# Patient Record
Sex: Male | Born: 1985 | Race: Black or African American | Hispanic: No | Marital: Married | Smoking: Former smoker
Health system: Southern US, Community
[De-identification: ages and names within clinical notes are randomized; demographics above are authoritative.]

## PROBLEM LIST (undated history)

## (undated) DIAGNOSIS — N62 Hypertrophy of breast: Secondary | ICD-10-CM

---

## 2014-09-14 ENCOUNTER — Encounter (HOSPITAL_COMMUNITY): Payer: Self-pay | Admitting: *Deleted

## 2014-09-14 ENCOUNTER — Emergency Department (HOSPITAL_COMMUNITY)
Admission: EM | Admit: 2014-09-14 | Discharge: 2014-09-14 | Disposition: A | Discharge status: Home / Self Care | Payer: Federal, State, Local not specified - PPO | Attending: Emergency Medicine | Admitting: Emergency Medicine

## 2014-09-14 DIAGNOSIS — Z79899 Other long term (current) drug therapy: Secondary | ICD-10-CM | POA: Diagnosis not present

## 2014-09-14 DIAGNOSIS — K1379 Other lesions of oral mucosa: Secondary | ICD-10-CM | POA: Insufficient documentation

## 2014-09-14 DIAGNOSIS — R509 Fever, unspecified: Secondary | ICD-10-CM | POA: Insufficient documentation

## 2014-09-14 DIAGNOSIS — K088 Other specified disorders of teeth and supporting structures: Secondary | ICD-10-CM | POA: Diagnosis present

## 2014-09-14 DIAGNOSIS — K1370 Unspecified lesions of oral mucosa: Secondary | ICD-10-CM

## 2014-09-14 MED ORDER — IBUPROFEN 600 MG PO TABS: 600.0000 mg | ORAL_TABLET | Freq: Four times a day (QID) | ORAL | Status: DC | PRN

## 2014-09-14 MED ORDER — MAGIC MOUTHWASH: 5.0000 mL | Freq: Four times a day (QID) | ORAL | Status: DC | PRN

## 2014-09-14 MED ORDER — IBUPROFEN 200 MG PO TABS: 600.0000 mg | ORAL_TABLET | Freq: Once | ORAL | Status: DC

## 2014-09-14 MED ORDER — MAGIC MOUTHWASH: 5.0000 mL | Freq: Once | ORAL | Status: DC

## 2014-09-14 NOTE — ED Provider Notes (Signed)
CSN: [id number]     Arrival date & time 09/14/14  2150 History  This chart was scribed for Earley Favor, NP, working with Arby Barrette, MD by Chestine Spore, ED Scribe. The patient was seen in room WTR6/WTR6 at 10:02 PM.     Chief Complaint  Patient presents with  . Dental Pain      The history is provided by the patient. No language interpreter was used.    HPI Comments: Samuel Byrd is a 29 y.o. male who presents to the Emergency Department complaining of dental pain onset 2 days. Pt had an abscess to his gum 1 week ago that he treated with orajel but was not seen for. Pt woke up with the pain to the roof of his mouth and he didn't do anything to make the pain start. He notes that the pain is to the roof of his mouth that is worsened with drinking hot or cold fluids. He reports that there is no pain when he is not touching the area. He states that he is having associated symptoms of fever without a documented temperature. He denies tooth pain, rhinorrhea, fever, nasal pain, and any other symptoms.   History reviewed. No pertinent past medical history. History reviewed. No pertinent past surgical history. No family history on file. History  Substance Use Topics  . Smoking status: Never Smoker   . Smokeless tobacco: Not on file  . Alcohol Use: Yes    Review of Systems  Constitutional: Positive for fever (undocumented).  HENT: Negative for dental problem and rhinorrhea.        Pain to the roof of the mouth      Allergies  Review of patient's allergies indicates not on file.  Home Medications   Prior to Admission medications   Medication Sig Start Date End Date Taking? Authorizing Provider  Alum & Mag Hydroxide-Simeth (MAGIC MOUTHWASH) SOLN Take 5 mLs by mouth 4 (four) times daily as needed for mouth pain. 09/14/14   Earley Favor, NP  ibuprofen (ADVIL,MOTRIN) 600 MG tablet Take 1 tablet (600 mg total) by mouth every 6 (six) hours as needed for moderate pain. 09/14/14   Earley Favor,  NP   BP 106/72 mmHg  Pulse 73  Temp(Src) 98.5 F (36.9 C) (Oral)  Resp 13  SpO2 100% Physical Exam  Constitutional: He is oriented to person, place, and time. He appears well-developed and well-nourished. No distress.  HENT:  Head: Normocephalic and atraumatic.  Mouth/Throat: No dental abscesses.  No pain in teeth. Small area that is slightly raised and tender to touch.   Eyes: EOM are normal.  Neck: Neck supple. No tracheal deviation present.  Cardiovascular: Normal rate.   Pulmonary/Chest: Effort normal. No respiratory distress.  Musculoskeletal: Normal range of motion.  Lymphadenopathy:  No LAD  Neurological: He is alert and oriented to person, place, and time.  Skin: Skin is warm and dry.  Psychiatric: He has a normal mood and affect. His behavior is normal.  Nursing note and vitals reviewed.   ED Course  Procedures (including critical care time) DIAGNOSTIC STUDIES: Oxygen Saturation is 100% on RA, nl by my interpretation.    COORDINATION OF CARE: 10:07 PM-Discussed treatment plan with pt at bedside and pt agreed to plan.   Labs Review Labs Reviewed - No data to display  Imaging Review No results found.   EKG Interpretation None     Dr. Clarice Pole examined the patient as well.  There is no sign of illness.  At this  point, it may be viral in nature or traumatic due to a burn or trauma from food.  He's been given Magic mouthwash and ibuprofen and return precautions as well as referral to ENT if needed MDM   Final diagnoses:  Mouth lesion   I personally performed the services described in this documentation, which was scribed in my presence. The recorded information has been reviewed and is accurate.   Earley FavorGail Brynnlee Cumpian, NP 09/14/14 [phone]  Arby BarretteMarcy Pfeiffer, MD 09/18/14 [phone]

## 2014-09-14 NOTE — ED Notes (Signed)
Pt complains of pain in the roof of his mouth for the past 2 days. Pt states the pain is worse when drinking hot/cold drinks.

## 2014-09-14 NOTE — Discharge Instructions (Signed)
You have very tiny skin erosions on the palate of the mouth which is consistent with trauma.  This can be from foods that you've eaten or fluids that are too hot.  He been given prescription for ibuprofen to take for comfort as well as Magic mouthwash that he continues to soothe the area.  If you develop worsening ulcers new symptoms, fever, swollen glands return for further evaluation

## 2015-07-22 ENCOUNTER — Emergency Department (HOSPITAL_COMMUNITY): Payer: Federal, State, Local not specified - PPO

## 2015-07-22 ENCOUNTER — Encounter (HOSPITAL_COMMUNITY): Payer: Self-pay | Admitting: Emergency Medicine

## 2015-07-22 ENCOUNTER — Observation Stay (HOSPITAL_COMMUNITY)
Admission: EM | Admit: 2015-07-22 | Discharge: 2015-07-24 | Disposition: A | Discharge status: Home / Self Care | Payer: Federal, State, Local not specified - PPO | Attending: Internal Medicine | Admitting: Internal Medicine

## 2015-07-22 DIAGNOSIS — R55 Syncope and collapse: Secondary | ICD-10-CM | POA: Insufficient documentation

## 2015-07-22 DIAGNOSIS — Z8711 Personal history of peptic ulcer disease: Secondary | ICD-10-CM | POA: Insufficient documentation

## 2015-07-22 DIAGNOSIS — Y92002 Bathroom of unspecified non-institutional (private) residence single-family (private) house as the place of occurrence of the external cause: Secondary | ICD-10-CM | POA: Insufficient documentation

## 2015-07-22 DIAGNOSIS — W228XXA Striking against or struck by other objects, initial encounter: Secondary | ICD-10-CM | POA: Diagnosis not present

## 2015-07-22 DIAGNOSIS — K921 Melena: Secondary | ICD-10-CM | POA: Diagnosis not present

## 2015-07-22 DIAGNOSIS — D62 Acute posthemorrhagic anemia: Secondary | ICD-10-CM | POA: Diagnosis not present

## 2015-07-22 DIAGNOSIS — R9431 Abnormal electrocardiogram [ECG] [EKG]: Secondary | ICD-10-CM | POA: Insufficient documentation

## 2015-07-22 DIAGNOSIS — K922 Gastrointestinal hemorrhage, unspecified: Secondary | ICD-10-CM | POA: Diagnosis not present

## 2015-07-22 DIAGNOSIS — R42 Dizziness and giddiness: Secondary | ICD-10-CM | POA: Insufficient documentation

## 2015-07-22 DIAGNOSIS — K259 Gastric ulcer, unspecified as acute or chronic, without hemorrhage or perforation: Secondary | ICD-10-CM | POA: Insufficient documentation

## 2015-07-22 DIAGNOSIS — R17 Unspecified jaundice: Secondary | ICD-10-CM | POA: Insufficient documentation

## 2015-07-22 DIAGNOSIS — K269 Duodenal ulcer, unspecified as acute or chronic, without hemorrhage or perforation: Secondary | ICD-10-CM | POA: Diagnosis not present

## 2015-07-22 DIAGNOSIS — R110 Nausea: Secondary | ICD-10-CM | POA: Insufficient documentation

## 2015-07-22 DIAGNOSIS — K2950 Unspecified chronic gastritis without bleeding: Secondary | ICD-10-CM | POA: Insufficient documentation

## 2015-07-22 DIAGNOSIS — K254 Chronic or unspecified gastric ulcer with hemorrhage: Secondary | ICD-10-CM | POA: Diagnosis not present

## 2015-07-22 LAB — BILIRUBIN, FRACTIONATED(TOT/DIR/INDIR)
BILIRUBIN DIRECT: 0.2 mg/dL (ref 0.1–0.5)
Indirect Bilirubin: 1.3 mg/dL — ABNORMAL HIGH (ref 0.3–0.9)
Total Bilirubin: 1.5 mg/dL — ABNORMAL HIGH (ref 0.3–1.2)

## 2015-07-22 LAB — COMPREHENSIVE METABOLIC PANEL
ALK PHOS: 35 U/L — AB (ref 38–126)
ALT: 15 U/L — ABNORMAL LOW (ref 17–63)
ANION GAP: 4 — AB (ref 5–15)
AST: 19 U/L (ref 15–41)
Albumin: 3.7 g/dL (ref 3.5–5.0)
BILIRUBIN TOTAL: 1.7 mg/dL — AB (ref 0.3–1.2)
BUN: 39 mg/dL — AB (ref 6–20)
CALCIUM: 8.7 mg/dL — AB (ref 8.9–10.3)
CO2: 25 mmol/L (ref 22–32)
CREATININE: 0.88 mg/dL (ref 0.61–1.24)
Chloride: 105 mmol/L (ref 101–111)
GFR calc non Af Amer: >60 mL/min (ref >60)
Glucose, Bld: 143 mg/dL — ABNORMAL HIGH (ref 65–99)
POTASSIUM: 4.1 mmol/L (ref 3.5–5.1)
Sodium: 134 mmol/L — ABNORMAL LOW (ref 135–145)
Total Protein: 6.9 g/dL (ref 6.5–8.1)

## 2015-07-22 LAB — PROTIME-INR
INR: 1.13 (ref 0.00–1.49)
PROTHROMBIN TIME: 14.7 s (ref 11.6–15.2)

## 2015-07-22 LAB — URINALYSIS, ROUTINE W REFLEX MICROSCOPIC
Bilirubin Urine: NEGATIVE
Glucose, UA: NEGATIVE mg/dL
Hgb urine dipstick: NEGATIVE
KETONES UR: NEGATIVE mg/dL
LEUKOCYTES UA: NEGATIVE
Nitrite: NEGATIVE
PH: 6 (ref 5.0–8.0)
PROTEIN: NEGATIVE mg/dL
Specific Gravity, Urine: 1.028 (ref 1.005–1.030)

## 2015-07-22 LAB — ABO/RH: ABO/RH(D): O POS

## 2015-07-22 LAB — CBC
HCT: 27.1 % — ABNORMAL LOW (ref 39.0–52.0)
Hemoglobin: 9.3 g/dL — ABNORMAL LOW (ref 13.0–17.0)
MCH: 26.3 pg (ref 26.0–34.0)
MCHC: 34.3 g/dL (ref 30.0–36.0)
MCV: 76.6 fL — AB (ref 78.0–100.0)
PLATELETS: 178 10*3/uL (ref 150–400)
RBC: 3.54 MIL/uL — ABNORMAL LOW (ref 4.22–5.81)
RDW: 13.1 % (ref 11.5–15.5)
WBC: 4.8 10*3/uL (ref 4.0–10.5)

## 2015-07-22 LAB — HEMOGLOBIN AND HEMATOCRIT, BLOOD
HCT: 26 % — ABNORMAL LOW (ref 39.0–52.0)
Hemoglobin: 9 g/dL — ABNORMAL LOW (ref 13.0–17.0)

## 2015-07-22 LAB — PREPARE RBC (CROSSMATCH)

## 2015-07-22 LAB — CBG MONITORING, ED: Glucose-Capillary: 111 mg/dL — ABNORMAL HIGH (ref 65–99)

## 2015-07-22 LAB — RETICULOCYTES
RBC.: 3.41 MIL/uL — AB (ref 4.22–5.81)
RETIC CT PCT: 1.5 % (ref 0.4–3.1)
Retic Count, Absolute: 51.2 10*3/uL (ref 19.0–186.0)

## 2015-07-22 LAB — TROPONIN I

## 2015-07-22 LAB — I-STAT CG4 LACTIC ACID, ED: LACTIC ACID, VENOUS: 0.8 mmol/L (ref 0.5–2.0)

## 2015-07-22 MED ORDER — SODIUM CHLORIDE 0.9 % IV SOLN
INTRAVENOUS | Status: DC
  Administered 2015-07-22 – 2015-07-23 (×2): via INTRAVENOUS

## 2015-07-22 MED ORDER — SODIUM CHLORIDE 0.9 % IV SOLN
80.0000 mg | Freq: Once | INTRAVENOUS | Status: AC
  Administered 2015-07-22: 80 mg via INTRAVENOUS
  Filled 2015-07-22: qty 80

## 2015-07-22 MED ORDER — TRAMADOL HCL 50 MG PO TABS
50.0000 mg | ORAL_TABLET | Freq: Three times a day (TID) | ORAL | Status: DC | PRN
  Administered 2015-07-22: 50 mg via ORAL
  Filled 2015-07-22: qty 1

## 2015-07-22 MED ORDER — SODIUM CHLORIDE 0.9% FLUSH
3.0000 mL | Freq: Two times a day (BID) | INTRAVENOUS | Status: DC
  Administered 2015-07-23: 3 mL via INTRAVENOUS

## 2015-07-22 MED ORDER — SODIUM CHLORIDE 0.9 % IV SOLN
8.0000 mg/h | INTRAVENOUS | Status: DC
  Administered 2015-07-23: 8 mg/h via INTRAVENOUS
  Filled 2015-07-22 (×5): qty 80

## 2015-07-22 MED ORDER — SODIUM CHLORIDE 0.9 % IV SOLN
INTRAVENOUS | Status: DC
  Administered 2015-07-23: 08:00:00 via INTRAVENOUS

## 2015-07-22 MED ORDER — ONDANSETRON HCL 4 MG PO TABS: 4.0000 mg | ORAL_TABLET | Freq: Four times a day (QID) | ORAL | Status: DC | PRN

## 2015-07-22 MED ORDER — SODIUM CHLORIDE 0.9 % IV SOLN
Freq: Once | INTRAVENOUS | Status: AC
  Administered 2015-07-22: 21:00:00 via INTRAVENOUS

## 2015-07-22 MED ORDER — ACETAMINOPHEN 325 MG PO TABS: 650.0000 mg | ORAL_TABLET | Freq: Four times a day (QID) | ORAL | Status: DC | PRN

## 2015-07-22 MED ORDER — ACETAMINOPHEN 650 MG RE SUPP: 650.0000 mg | Freq: Four times a day (QID) | RECTAL | Status: DC | PRN

## 2015-07-22 MED ORDER — ONDANSETRON HCL 4 MG/2ML IJ SOLN: 4.0000 mg | Freq: Four times a day (QID) | INTRAMUSCULAR | Status: DC | PRN

## 2015-07-22 MED ORDER — PANTOPRAZOLE SODIUM 40 MG IV SOLR: 40.0000 mg | Freq: Two times a day (BID) | INTRAVENOUS | Status: DC

## 2015-07-22 NOTE — ED Notes (Signed)
Hospitalist at bedside 

## 2015-07-22 NOTE — ED Provider Notes (Signed)
CSN: [id number]     Arrival date & time 07/22/15  1515 History   First MD Initiated Contact with Patient 07/22/15 1558     Chief Complaint  Patient presents with  . Nausea  . Dizziness     Patient is a 30 y.o. male presenting with dizziness. The history is provided by the patient and a parent. No language interpreter was used.  Dizziness  Khaliel Morey is a 30 y.o. male who presents to the Emergency Department complaining of dizziness.  He was in his routine state of health until this morning when he began to feel dizzy/lightheaded.  Sxs are worse with standing.  He fell this morning in the bathroom and hit the back of his head, no LOC.  Since he hit his head he has a light headache and nausea as well as persistent dizziness.  He has a hx/o ulcer and takes pepcid daily, no additional meds. He occasional drinks alcohol.  No prior similar sxs. No associated chest pain, SOB, fever, cough, abdominal pain, vomiting, diarrhea, constipation, hematochezia, melena, leg swelling.  Sxs are severe, constant, worsening.    Past Medical History  Diagnosis Date  . Ulcer, stomach peptic    History reviewed. No pertinent past surgical history. No family history on file. Social History  Substance Use Topics  . Smoking status: Never Smoker   . Smokeless tobacco: None  . Alcohol Use: Yes    Review of Systems  Neurological: Positive for dizziness.  All other systems reviewed and are negative.     Allergies  Review of patient's allergies indicates no known allergies.  Home Medications   Prior to Admission medications   Medication Sig Start Date End Date Taking? Authorizing Provider  famotidine (PEPCID AC) 10 MG chewable tablet Chew 10-20 mg by mouth daily as needed for heartburn.   Yes Historical Provider, MD   BP 97/62 mmHg  Pulse 66  Temp(Src) 98.4 F (36.9 C) (Oral)  Resp 24  Ht 5\' 7"  (1.702 m)  Wt 130 lb (58.968 kg)  BMI 20.36 kg/m2  SpO2 100% Physical Exam  Constitutional: He is  oriented to person, place, and time. He appears well-developed and well-nourished. He appears distressed.  HENT:  Head: Normocephalic and atraumatic.  Cardiovascular: Normal rate and regular rhythm.   No murmur heard. Pulmonary/Chest: Effort normal and breath sounds normal. No respiratory distress.  Abdominal: Soft. There is no tenderness. There is no rebound and no guarding.  Genitourinary:  Melanotic stool, heme positive  Musculoskeletal: He exhibits no edema or tenderness.  Neurological: He is alert and oriented to person, place, and time.  Generalized weakness.  Skin: Skin is warm and dry.  Psychiatric: He has a normal mood and affect. His behavior is normal.  Nursing note and vitals reviewed.   ED Course  Procedures (including critical care time) Labs Review Labs Reviewed  CBC - Abnormal; Notable for the following:    RBC 3.54 (*)    Hemoglobin 9.3 (*)    HCT 27.1 (*)    MCV 76.6 (*)    All other components within normal limits  COMPREHENSIVE METABOLIC PANEL - Abnormal; Notable for the following:    Sodium 134 (*)    Glucose, Bld 143 (*)    BUN 39 (*)    Calcium 8.7 (*)    ALT 15 (*)    Alkaline Phosphatase 35 (*)    Total Bilirubin 1.7 (*)    Anion gap 4 (*)    All other components within  normal limits  VITAMIN B12 - Abnormal; Notable for the following:    Vitamin B-12 1019 (*)    All other components within normal limits  IRON AND TIBC - Abnormal; Notable for the following:    Iron 205 (*)    Saturation Ratios 61 (*)    All other components within normal limits  RETICULOCYTES - Abnormal; Notable for the following:    RBC. 3.41 (*)    All other components within normal limits  BILIRUBIN, FRACTIONATED(TOT/DIR/INDIR) - Abnormal; Notable for the following:    Total Bilirubin 1.5 (*)    Indirect Bilirubin 1.3 (*)    All other components within normal limits  HEMOGLOBIN AND HEMATOCRIT, BLOOD - Abnormal; Notable for the following:    Hemoglobin 9.0 (*)    HCT 26.0  (*)    All other components within normal limits  CBG MONITORING, ED - Abnormal; Notable for the following:    Glucose-Capillary 111 (*)    All other components within normal limits  URINALYSIS, ROUTINE W REFLEX MICROSCOPIC (NOT AT [facility])  FOLATE  FERRITIN  PROTIME-INR  TROPONIN I  HIV ANTIBODY (ROUTINE TESTING)  COMPREHENSIVE METABOLIC PANEL  CBC  HEMOGLOBIN AND HEMATOCRIT, BLOOD  HEMOGLOBIN AND HEMATOCRIT, BLOOD  I-STAT CG4 LACTIC ACID, ED  TYPE AND SCREEN  PREPARE RBC (CROSSMATCH)  ABO/RH    Imaging Review Ct Head Wo Contrast  07/22/2015  CLINICAL DATA:  Dizziness, headache EXAM: CT HEAD WITHOUT CONTRAST TECHNIQUE: Contiguous axial images were obtained from the base of the skull through the vertex without intravenous contrast. COMPARISON:  None. FINDINGS: There is no evidence of mass effect, midline shift or extra-axial fluid collections. There is no evidence of a space-occupying lesion or intracranial hemorrhage. There is no evidence of a cortical-based area of acute infarction. The ventricles and sulci are appropriate for the patient's age. The basal cisterns are patent. Visualized portions of the orbits are unremarkable. The visualized portions of the paranasal sinuses and mastoid air cells are unremarkable. The osseous structures are unremarkable. IMPRESSION: Normal CT of the brain without intravenous contrast. Electronically Signed   By: Elige Ko   On: 07/22/2015 16:28   Dg Chest Port 1 View  07/22/2015  CLINICAL DATA:  Headache today; no chest complaints today; non smoker EXAM: PORTABLE CHEST 1 VIEW COMPARISON:  None. FINDINGS: The heart size and mediastinal contours are within normal limits. Both lungs are clear. The visualized skeletal structures are unremarkable. IMPRESSION: No active disease. Electronically Signed   By: Norva Pavlov M.D.   On: 07/22/2015 16:15   I have personally reviewed and evaluated these images and lab results as part of my medical  decision-making.   EKG Interpretation   Date/Time:  Sunday Jul 22 2015 15:41:42 EDT Ventricular Rate:  106 PR Interval:  115 QRS Duration: 85 QT Interval:  325 QTC Calculation: 431 R Axis:   75 Text Interpretation:  Sinus tachycardia Consider right atrial enlargement  ST elevation suggests acute pericarditis Baseline wander in lead(s) V2 V3  Confirmed by Lincoln Brigham [phone]) on 07/22/2015 4:37:18 PM      MDM   Final diagnoses:  Acute upper GI bleed   Patient here for evaluation of dizziness, head injury earlier today. CBC demonstrates anemia with hemoglobin of 9.3. He has melanotic stool on rectal exam. Discussed the patient findings of GI bleed and need for admission for further treatment. His blood pressures have stabilized the emergency department, will not transfuse at this time. Plan to admit for further treatment. He  was given Protonix for presumed peptic ulcer bleed. Hospitalist consultated for admission.  D/w Dr. Elnoria HowardHung with [facility], will seen in consult.  Recommends the patient be kept NPO.     Tilden FossaElizabeth Elexius Minar, MD 07/23/15 [phone]

## 2015-07-22 NOTE — Consult Note (Signed)
Consult Note for [facility]  Reason for Consult: Anemia, heme positive stool, and syncope Referring Physician: [facility]  [place] HPI: This is a 30 year old male with a PMH of peptic ulcer who is admitted to the hospital after a syncopal episode.  He reports feeling unwell this morning and he passed out for approximately 5 minutes.  Afterwards he continued to feel dizzy and lightheaded.  These symptoms worsened with standing.  The patient reports having melena that started this morning.  In the past he was clinically diagnosed with PUD when he was living in [place] and Pepcid was prescribed, but no EGD was performed and he did not have melena.  Over the intervening two years he reports intermittent abdominal pain and nausea, however, these symptoms markedly worsened.  He does use ibuprofen occasionally and there is no history of ETOH.  In the ER his HGB was noted to be in the 9 range, but there were no baseline values for comparison.  A CT scan of his head was negative for any trauma.  The patient was noted to be hypotensive initially, but he responded well to IV hydration.    Past Medical History  Diagnosis Date  . Ulcer, stomach peptic     History reviewed. No pertinent past surgical history.  No family history on file.  Social History:  reports that he has never smoked. He does not have any smokeless tobacco history on file. He reports that he drinks alcohol. His drug history is not on file.  Allergies: No Known Allergies  Medications:  Scheduled: . sodium chloride   Intravenous Once  . [START ON 07/26/2015] pantoprazole (PROTONIX) IV  40 mg Intravenous Q12H  . sodium chloride flush  3 mL Intravenous Q12H   Continuous: . sodium chloride 100 mL/hr at 07/22/15 1845  . pantoprozole (PROTONIX) infusion      Results for orders placed or performed during the hospital encounter of 07/22/15 (from the past 24 hour(s))  CBG monitoring, ED     Status: Abnormal   Collection Time:  07/22/15  4:08 PM  Result Value Ref Range   Glucose-Capillary 111 (H) 65 - 99 mg/dL  CBC     Status: Abnormal   Collection Time: 07/22/15  4:29 PM  Result Value Ref Range   WBC 4.8 4.0 - 10.5 K/uL   RBC 3.54 (L) 4.22 - 5.81 MIL/uL   Hemoglobin 9.3 (L) 13.0 - 17.0 g/dL   HCT 16.1 (L) 09.6 - 04.5 %   MCV 76.6 (L) 78.0 - 100.0 fL   MCH 26.3 26.0 - 34.0 pg   MCHC 34.3 30.0 - 36.0 g/dL   RDW 40.9 81.1 - 91.4 %   Platelets 178 150 - 400 K/uL  Comprehensive metabolic panel     Status: Abnormal   Collection Time: 07/22/15  4:29 PM  Result Value Ref Range   Sodium 134 (L) 135 - 145 mmol/L   Potassium 4.1 3.5 - 5.1 mmol/L   Chloride 105 101 - 111 mmol/L   CO2 25 22 - 32 mmol/L   Glucose, Bld 143 (H) 65 - 99 mg/dL   BUN 39 (H) 6 - 20 mg/dL   Creatinine, Ser 7.82 0.61 - 1.24 mg/dL   Calcium 8.7 (L) 8.9 - 10.3 mg/dL   Total Protein 6.9 6.5 - 8.1 g/dL   Albumin 3.7 3.5 - 5.0 g/dL   AST 19 15 - 41 U/L   ALT 15 (L) 17 - 63 U/L   Alkaline Phosphatase  35 (L) 38 - 126 U/L   Total Bilirubin 1.7 (H) 0.3 - 1.2 mg/dL   GFR calc non Af Amer >60 >60 mL/min   GFR calc Af Amer >60 >60 mL/min   Anion gap 4 (L) 5 - 15  I-Stat CG4 Lactic Acid, ED     Status: None   Collection Time: 07/22/15  4:34 PM  Result Value Ref Range   Lactic Acid, Venous 0.80 0.5 - 2.0 mmol/L  Urinalysis, Routine w reflex microscopic     Status: None   Collection Time: 07/22/15  5:57 PM  Result Value Ref Range   Color, Urine YELLOW YELLOW   APPearance CLEAR CLEAR   Specific Gravity, Urine 1.028 1.005 - 1.030   pH 6.0 5.0 - 8.0   Glucose, UA NEGATIVE NEGATIVE mg/dL   Hgb urine dipstick NEGATIVE NEGATIVE   Bilirubin Urine NEGATIVE NEGATIVE   Ketones, ur NEGATIVE NEGATIVE mg/dL   Protein, ur NEGATIVE NEGATIVE mg/dL   Nitrite NEGATIVE NEGATIVE   Leukocytes, UA NEGATIVE NEGATIVE  Type and screen [facility]     Status: None (Preliminary result)   Collection Time: 07/22/15  6:03 PM  Result Value Ref Range    ABO/RH(D) O POS    Antibody Screen NEG    Sample Expiration 07/25/2015    Unit Number [id number]    Blood Component Type RED CELLS,LR    Unit division 00    Status of Unit ALLOCATED    Transfusion Status OK TO TRANSFUSE    Crossmatch Result Compatible   ABO/Rh     Status: None   Collection Time: 07/22/15  6:03 PM  Result Value Ref Range   ABO/RH(D) O POS   Reticulocytes     Status: Abnormal   Collection Time: 07/22/15  6:35 PM  Result Value Ref Range   Retic Ct Pct 1.5 0.4 - 3.1 %   RBC. 3.41 (L) 4.22 - 5.81 MIL/uL   Retic Count, Manual 51.2 19.0 - 186.0 K/uL  Bilirubin, fractionated(tot/dir/indir)     Status: Abnormal   Collection Time: 07/22/15  6:35 PM  Result Value Ref Range   Total Bilirubin 1.5 (H) 0.3 - 1.2 mg/dL   Bilirubin, Direct 0.2 0.1 - 0.5 mg/dL   Indirect Bilirubin 1.3 (H) 0.3 - 0.9 mg/dL  Prepare RBC     Status: None   Collection Time: 07/22/15  6:35 PM  Result Value Ref Range   Order Confirmation ORDER PROCESSED BY BLOOD BANK   Protime-INR     Status: None   Collection Time: 07/22/15  6:35 PM  Result Value Ref Range   Prothrombin Time 14.7 11.6 - 15.2 seconds   INR 1.13 0.00 - 1.49  Troponin I     Status: None   Collection Time: 07/22/15  6:35 PM  Result Value Ref Range   Troponin I <0.03 <0.031 ng/mL  Hemoglobin and hematocrit, blood     Status: Abnormal   Collection Time: 07/22/15  6:49 PM  Result Value Ref Range   Hemoglobin 9.0 (L) 13.0 - 17.0 g/dL   HCT 11.9 (L) 14.7 - 82.9 %     Ct Head Wo Contrast  07/22/2015  CLINICAL DATA:  Dizziness, headache EXAM: CT HEAD WITHOUT CONTRAST TECHNIQUE: Contiguous axial images were obtained from the base of the skull through the vertex without intravenous contrast. COMPARISON:  None. FINDINGS: There is no evidence of mass effect, midline shift or extra-axial fluid collections. There is no evidence of a space-occupying lesion or intracranial hemorrhage.  There is no evidence of a cortical-based area of acute  infarction. The ventricles and sulci are appropriate for the patient's age. The basal cisterns are patent. Visualized portions of the orbits are unremarkable. The visualized portions of the paranasal sinuses and mastoid air cells are unremarkable. The osseous structures are unremarkable. IMPRESSION: Normal CT of the brain without intravenous contrast. Electronically Signed   By: Elige KoHetal  Patel   On: 07/22/2015 16:28   Dg Chest Port 1 View  07/22/2015  CLINICAL DATA:  Headache today; no chest complaints today; non smoker EXAM: PORTABLE CHEST 1 VIEW COMPARISON:  None. FINDINGS: The heart size and mediastinal contours are within normal limits. Both lungs are clear. The visualized skeletal structures are unremarkable. IMPRESSION: No active disease. Electronically Signed   By: Norva PavlovElizabeth  Brown M.D.   On: 07/22/2015 16:15    ROS:  As stated above in the HPI otherwise negative.  Blood pressure 108/71, pulse 83, temperature 98.2 F (36.8 C), temperature source Oral, resp. rate 18, height 5\' 7"  (1.702 m), weight 58.968 kg (130 lb), SpO2 100 %.    PE: Gen: NAD, Alert and Oriented HEENT:  [place]/AT, EOMI Neck: Supple, no LAD Lungs: CTA Bilaterally CV: RRR without M/G/R ABM: Soft, minimal periumbilical pain, +BS Ext: No C/C/E  Assessment/Plan: 1) Probable upper GI bleed. 2) Anemia. 3) Melena/Heme positive stool.   With his presentation and EGD will be pursued.  He his hemodynamically stable at this time.  From [place] H[place] Pyl[place] needs to be considered as a precipitating source of PUD, or at least a contributing factor.  Plan: 1) EGD tomorrow. 2) Agree with PPI. 3) Follow HGB and transfuse as necessary.  Rumaysa Sabatino D 07/22/2015, 7:52 PM

## 2015-07-22 NOTE — H&P (Signed)
History and Physical    Niv Darley ZOX:[id number] DOB: 1986-03-08 DOA: 07/22/2015  PCP: No primary care provider on file.  Patient coming from: Home  Chief Complaint: Dizziness, nausea.   HPI: Samuel Byrd is a 30 y.o. male with no medical history who presents after a syncope episode. Patient was using the bathroom when he stand up , he felt dizzy and pass out. He hit his head with the floor. He think he was out for 5 minutes. Denies tongue bite , urinary incontinence. He is feeling lightheaded and dizziness, specially when he stands up. He report poor oral intake since yesterday and nausea. He has had abdominal pain on and off for 2 years. He was prescribe Pepcid to treat presume ulcer. His abdominal pain gets worse with spicy, fatty food. Patient was notice to have melena and occult blood positive on rectal exam perform by ED physician. Patient report black stool started this am. He report abdominal pain, nausea. No diarrhea. Maybe some weight loss per wife. Patient complaining of headaches. He denies chest pain or dyspnea.  He takes ibuprofen occasionally.   ED Course: Rectal exam; melena, occult blood positive, Hb at 9, BUN 39. CT head negative, chest x ray negative.   Review of Systems: negative, except as per HPI   Past Medical History  Diagnosis Date  . Ulcer, stomach peptic     PSH; none  reports that he has never smoked. He does not have any smokeless tobacco history on file. He reports that he drinks alcohol. His drug history is not on file. drinks alcohol occassionally   No Known Allergies  Family History; Mother deceased, cardiac arrest unknown cause. Father history of hemorrhoids.   Prior to Admission medications   Medication Sig Start Date End Date Taking? Authorizing Provider  famotidine (PEPCID AC) 10 MG chewable tablet Chew 10-20 mg by mouth daily as needed for heartburn.   Yes Historical Provider, MD    Physical Exam: Filed Vitals:   07/22/15 1730 07/22/15 1745  07/22/15 1800 07/22/15 1809  BP: 107/74 107/67 108/71 108/71  Pulse: 81 87 83 83  Temp:      TempSrc:      Resp: Height:      Weight:      SpO2: 100% 100% 100% 100%      Constitutional: NAD, calm, comfortable Filed Vitals:   07/22/15 1730 07/22/15 1745 07/22/15 1800 07/22/15 1809  BP: 107/74 107/67 108/71 108/71  Pulse: 81 87 83 83  Temp:      TempSrc:      Resp: Height:      Weight:      SpO2: 100% 100% 100% 100%   Eyes: PERRL, lids and conjunctivae normal, pale ENMT: Mucous membranes are moist. Posterior pharynx clear of any exudate or lesions.Normal dentition.  Neck: normal, supple, no masses, no thyromegaly Respiratory: clear to auscultation bilaterally, no wheezing, no crackles. Normal respiratory effort. No accessory muscle use.  Cardiovascular: Regular rate and rhythm, no murmurs / rubs / gallops. No extremity edema. 2+ pedal pulses. No carotid bruits.  Abdomen: no tenderness, no masses palpated. No hepatosplenomegaly. Bowel sounds positive.  Musculoskeletal: no clubbing / cyanosis. No joint deformity upper and lower extremities. Good ROM, no contractures. Normal muscle tone.  Skin: no rashes, lesions, ulcers. No induration Neurologic: CN 2-12 grossly intact. Sensation intact, DTR normal. Strength 5/5 in all 4.  Psychiatric: Normal judgment and insight. Alert and oriented x 3. Normal  mood.   Labs on Admission: I have personally reviewed following labs and imaging studies  CBC:  Recent Labs Lab 07/22/15 1629  WBC 4.8  HGB 9.3*  HCT 27.1*  MCV 76.6*  PLT 178   Basic Metabolic Panel:  Recent Labs Lab 07/22/15 1629  NA 134*  K 4.1  CL 105  CO2 25  GLUCOSE 143*  BUN 39*  CREATININE 0.88  CALCIUM 8.7*   GFR: Estimated Creatinine Clearance: 100.6 mL/min (by C-G formula based on Cr of 0.88). Liver Function Tests:  Recent Labs Lab 07/22/15 1629  AST 19  ALT 15*  ALKPHOS 35*  BILITOT 1.7*  PROT 6.9  ALBUMIN 3.7   No  results for input(s): LIPASE, AMYLASE in the last 168 hours. No results for input(s): AMMONIA in the last 168 hours. Coagulation Profile: No results for input(s): INR, PROTIME in the last 168 hours. Cardiac Enzymes: No results for input(s): CKTOTAL, CKMB, CKMBINDEX, TROPONINI in the last 168 hours. BNP (last 3 results) No results for input(s): PROBNP in the last 8760 hours. HbA1C: No results for input(s): HGBA1C in the last 72 hours. CBG:  Recent Labs Lab 07/22/15 1608  GLUCAP 111*   Lipid Profile: No results for input(s): CHOL, HDL, LDLCALC, TRIG, CHOLHDL, LDLDIRECT in the last 72 hours. Thyroid Function Tests: No results for input(s): TSH, T4TOTAL, FREET4, T3FREE, THYROIDAB in the last 72 hours. Anemia Panel: No results for input(s): VITAMINB12, FOLATE, FERRITIN, TIBC, IRON, RETICCTPCT in the last 72 hours. Urine analysis:    Component Value Date/Time   COLORURINE YELLOW 07/22/2015 1757   APPEARANCEUR CLEAR 07/22/2015 1757   LABSPEC 1.028 07/22/2015 1757   PHURINE 6.0 07/22/2015 1757   GLUCOSEU NEGATIVE 07/22/2015 1757   HGBUR NEGATIVE 07/22/2015 1757   BILIRUBINUR NEGATIVE 07/22/2015 1757   KETONESUR NEGATIVE 07/22/2015 1757   PROTEINUR NEGATIVE 07/22/2015 1757   NITRITE NEGATIVE 07/22/2015 1757   LEUKOCYTESUR NEGATIVE 07/22/2015 1757   Sepsis Labs:  )No results found for this or any previous visit (from the past 240 hour(s)).   Radiological Exams on Admission: Ct Head Wo Contrast  07/22/2015  CLINICAL DATA:  Dizziness, headache EXAM: CT HEAD WITHOUT CONTRAST TECHNIQUE: Contiguous axial images were obtained from the base of the skull through the vertex without intravenous contrast. COMPARISON:  None. FINDINGS: There is no evidence of mass effect, midline shift or extra-axial fluid collections. There is no evidence of a space-occupying lesion or intracranial hemorrhage. There is no evidence of a cortical-based area of acute infarction. The ventricles and sulci are  appropriate for the patient's age. The basal cisterns are patent. Visualized portions of the orbits are unremarkable. The visualized portions of the paranasal sinuses and mastoid air cells are unremarkable. The osseous structures are unremarkable. IMPRESSION: Normal CT of the brain without intravenous contrast. Electronically Signed   By: Elige Ko   On: 07/22/2015 16:28   Dg Chest Port 1 View  07/22/2015  CLINICAL DATA:  Headache today; no chest complaints today; non smoker EXAM: PORTABLE CHEST 1 VIEW COMPARISON:  None. FINDINGS: The heart size and mediastinal contours are within normal limits. Both lungs are clear. The visualized skeletal structures are unremarkable. IMPRESSION: No active disease. Electronically Signed   By: Norva Pavlov M.D.   On: 07/22/2015 16:15    EKG: Sinus tachycardia,ST elevation.  Assessment/Plan Active Problems:   Acute upper GI bleed   GI bleed   Acute blood loss anemia   Syncope and collapse  1-Melena, GI bleed;  Admit to step  down unit.  IV fluids. IV protinix. Zofran PRN.  GI consulted. Patient will need endoscopy/  Clear diet and NPO after midnight/  Transfuse one unit PRBC.  Cycle hb.  Check INR>   2-Anemia, acute blood loss; Presents with melena. Check anemia panel.  Will transfuse one unit PRBC, for symptomatic anemia.   3-Syncope; Suspect related to hypotension, anemia. IV fluids, PRBC transfusion.  Monitor on telemetry. Check ECHO.  CT head negative.   4-Abnormal EKG; He denies chest pain or dyspnea. Will check ECHO.  Family history; mother died sudden death at 8054.  Check troponin. Marland Kitchen.   5-Screening for HIV.   DVT prophylaxis: scd Code Status: presume full code.  Family Communication: care discussed with wife who was at bedside,  Disposition Plan: home in 2 days Consults called: GI, called by ED physician. Admission status: stepdown, observation.   Alba Coryegalado, Yoshiye Kraft A MD [facility] Pager [phone]  If 7PM-7AM,  please contact night-coverage [email or web address] Password [email or web address]  07/22/2015, 6:36 PM

## 2015-07-22 NOTE — ED Notes (Signed)
Pt in CT.

## 2015-07-22 NOTE — ED Notes (Addendum)
Pt states he was going to the bathroom this morning and got dizzy and fell. Pt did not experience a LOC, but he states he did hit his head on his sink. Pt has no swelling to his head. Pt has no pain upon palpation. Pt states he does have a headache that he rates at 4/10. Pt is hypotensive at time of assessment at 93/52  Pt has c/o nausea without vomiting and also denies diarrhea

## 2015-07-22 NOTE — ED Notes (Signed)
X-ray at bedside

## 2015-07-23 ENCOUNTER — Observation Stay (HOSPITAL_COMMUNITY): Payer: Federal, State, Local not specified - PPO | Admitting: Anesthesiology

## 2015-07-23 ENCOUNTER — Encounter (HOSPITAL_COMMUNITY): Payer: Self-pay

## 2015-07-23 ENCOUNTER — Encounter (HOSPITAL_COMMUNITY)
Admission: EM | Disposition: A | Discharge status: Home / Self Care | Payer: Self-pay | Source: Home / Self Care | Attending: Emergency Medicine

## 2015-07-23 ENCOUNTER — Observation Stay (HOSPITAL_BASED_OUTPATIENT_CLINIC_OR_DEPARTMENT_OTHER): Payer: Federal, State, Local not specified - PPO

## 2015-07-23 DIAGNOSIS — K259 Gastric ulcer, unspecified as acute or chronic, without hemorrhage or perforation: Secondary | ICD-10-CM | POA: Diagnosis not present

## 2015-07-23 DIAGNOSIS — K254 Chronic or unspecified gastric ulcer with hemorrhage: Secondary | ICD-10-CM

## 2015-07-23 DIAGNOSIS — K269 Duodenal ulcer, unspecified as acute or chronic, without hemorrhage or perforation: Secondary | ICD-10-CM | POA: Diagnosis not present

## 2015-07-23 DIAGNOSIS — K921 Melena: Secondary | ICD-10-CM

## 2015-07-23 DIAGNOSIS — D62 Acute posthemorrhagic anemia: Secondary | ICD-10-CM | POA: Diagnosis not present

## 2015-07-23 DIAGNOSIS — R55 Syncope and collapse: Secondary | ICD-10-CM | POA: Diagnosis not present

## 2015-07-23 HISTORY — PX: ESOPHAGOGASTRODUODENOSCOPY: SHX5428

## 2015-07-23 LAB — IRON AND TIBC
IRON: 205 ug/dL — AB (ref 45–182)
SATURATION RATIOS: 61 % — AB (ref 17.9–39.5)
TIBC: 335 ug/dL (ref 250–450)
UIBC: 130 ug/dL

## 2015-07-23 LAB — ECHOCARDIOGRAM COMPLETE
Height: 67 in
Weight: 2017.65 oz

## 2015-07-23 LAB — FOLATE: FOLATE: 16.1 ng/mL (ref >5.9)

## 2015-07-23 LAB — TYPE AND SCREEN
ABO/RH(D): O POS
Antibody Screen: NEGATIVE
Unit division: 0

## 2015-07-23 LAB — COMPREHENSIVE METABOLIC PANEL
ALT: 14 U/L — AB (ref 17–63)
AST: 19 U/L (ref 15–41)
Albumin: 3.2 g/dL — ABNORMAL LOW (ref 3.5–5.0)
Alkaline Phosphatase: 31 U/L — ABNORMAL LOW (ref 38–126)
Anion gap: 6 (ref 5–15)
BUN: 25 mg/dL — AB (ref 6–20)
CHLORIDE: 105 mmol/L (ref 101–111)
CO2: 24 mmol/L (ref 22–32)
CREATININE: 0.82 mg/dL (ref 0.61–1.24)
Calcium: 8.3 mg/dL — ABNORMAL LOW (ref 8.9–10.3)
GFR calc Af Amer: >60 mL/min (ref >60)
GFR calc non Af Amer: >60 mL/min (ref >60)
Glucose, Bld: 87 mg/dL (ref 65–99)
Potassium: 3.2 mmol/L — ABNORMAL LOW (ref 3.5–5.1)
SODIUM: 135 mmol/L (ref 135–145)
Total Bilirubin: 2.7 mg/dL — ABNORMAL HIGH (ref 0.3–1.2)
Total Protein: 5.5 g/dL — ABNORMAL LOW (ref 6.5–8.1)

## 2015-07-23 LAB — SAVE SMEAR

## 2015-07-23 LAB — LACTATE DEHYDROGENASE: LDH: 99 U/L (ref 98–192)

## 2015-07-23 LAB — CBC
HCT: 28.2 % — ABNORMAL LOW (ref 39.0–52.0)
Hemoglobin: 9.8 g/dL — ABNORMAL LOW (ref 13.0–17.0)
MCH: 26.5 pg (ref 26.0–34.0)
MCHC: 34.8 g/dL (ref 30.0–36.0)
MCV: 76.2 fL — AB (ref 78.0–100.0)
PLATELETS: 142 10*3/uL — AB (ref 150–400)
RBC: 3.7 MIL/uL — ABNORMAL LOW (ref 4.22–5.81)
RDW: 13.6 % (ref 11.5–15.5)
WBC: 5 10*3/uL (ref 4.0–10.5)

## 2015-07-23 LAB — HEMOGLOBIN AND HEMATOCRIT, BLOOD
HCT: 26.7 % — ABNORMAL LOW (ref 39.0–52.0)
Hemoglobin: 9 g/dL — ABNORMAL LOW (ref 13.0–17.0)

## 2015-07-23 LAB — VITAMIN B12: Vitamin B-12: 1019 pg/mL — ABNORMAL HIGH (ref 180–914)

## 2015-07-23 LAB — MRSA PCR SCREENING: MRSA by PCR: INVALID — AB

## 2015-07-23 LAB — FERRITIN: FERRITIN: 104 ng/mL (ref 24–336)

## 2015-07-23 SURGERY — EGD (ESOPHAGOGASTRODUODENOSCOPY)
Anesthesia: General

## 2015-07-23 MED ORDER — PROPOFOL 10 MG/ML IV BOLUS
INTRAVENOUS | Status: DC | PRN
  Administered 2015-07-23: 20 mg via INTRAVENOUS
  Administered 2015-07-23: 10 mg via INTRAVENOUS
  Administered 2015-07-23 (×2): 20 mg via INTRAVENOUS
  Administered 2015-07-23: 10 mg via INTRAVENOUS
  Administered 2015-07-23: 40 mg via INTRAVENOUS
  Administered 2015-07-23: 20 mg via INTRAVENOUS
  Administered 2015-07-23: 10 mg via INTRAVENOUS
  Administered 2015-07-23 (×2): 30 mg via INTRAVENOUS

## 2015-07-23 MED ORDER — POTASSIUM CHLORIDE CRYS ER 20 MEQ PO TBCR: 40.0000 meq | EXTENDED_RELEASE_TABLET | Freq: Once | ORAL | Status: DC

## 2015-07-23 MED ORDER — PROPOFOL 10 MG/ML IV BOLUS
INTRAVENOUS | Status: AC
  Filled 2015-07-23: qty 20

## 2015-07-23 MED ORDER — POTASSIUM CHLORIDE 10 MEQ/100ML IV SOLN
10.0000 meq | INTRAVENOUS | Status: AC
  Administered 2015-07-23 (×3): 10 meq via INTRAVENOUS
  Filled 2015-07-23 (×3): qty 100

## 2015-07-23 MED ORDER — PANTOPRAZOLE SODIUM 40 MG PO TBEC
40.0000 mg | DELAYED_RELEASE_TABLET | Freq: Two times a day (BID) | ORAL | Status: DC
  Filled 2015-07-23 (×3): qty 1

## 2015-07-23 MED ORDER — LACTATED RINGERS IV SOLN
INTRAVENOUS | Status: DC
  Administered 2015-07-23: 1000 mL via INTRAVENOUS

## 2015-07-23 MED ORDER — BUTAMBEN-TETRACAINE-BENZOCAINE 2-2-14 % EX AERO
INHALATION_SPRAY | CUTANEOUS | Status: DC | PRN
  Administered 2015-07-23: 2 via TOPICAL

## 2015-07-23 NOTE — Progress Notes (Signed)
Pt admitted to room 1418. A&Ox4. Agree with ICU RN's Shift Assessment.  Delford FieldGagliano, Janan Bogie E, RN

## 2015-07-23 NOTE — Anesthesia Postprocedure Evaluation (Signed)
Anesthesia Post Note  Patient: West BaliKenneth Divis  Procedure(s) Performed: Procedure(s) (LRB): ESOPHAGOGASTRODUODENOSCOPY (EGD) (N/A)  Patient location during evaluation: Endoscopy Anesthesia Type: MAC Level of consciousness: awake and alert Pain management: pain level controlled Vital Signs Assessment: post-procedure vital signs reviewed and stable Respiratory status: spontaneous breathing, nonlabored ventilation, respiratory function stable and patient connected to nasal cannula oxygen Cardiovascular status: stable and blood pressure returned to baseline Anesthetic complications: no    Last Vitals:  Filed Vitals:   07/23/15 1100 07/23/15 1110  BP: 107/63   Pulse: 152 75  Temp:    Resp: 15 14    Last Pain:  Filed Vitals:   07/23/15 1110  PainSc: 4                  Peace Jost,JAMES TERRILL

## 2015-07-23 NOTE — Anesthesia Preprocedure Evaluation (Signed)
Anesthesia Evaluation  Patient identified by MRN, date of birth, ID band Patient awake    Airway Mallampati: I       Dental  (+) Teeth Intact   Pulmonary neg pulmonary ROS,    breath sounds clear to auscultation       Cardiovascular negative cardio ROS   Rhythm:Regular Rate:Normal     Neuro/Psych negative neurological ROS     GI/Hepatic Neg liver ROS, PUD, Gi bleed   Endo/Other  negative endocrine ROS  Renal/GU negative Renal ROS     Musculoskeletal   Abdominal   Peds  Hematology  (+) Blood dyscrasia, ,   Anesthesia Other Findings   Reproductive/Obstetrics                             Anesthesia Physical Anesthesia Plan  ASA: II  Anesthesia Plan: General   Post-op Pain Management:    Induction: Intravenous  Airway Management Planned: Natural Airway and Nasal Cannula  Additional Equipment:   Intra-op Plan:   Post-operative Plan: Extubation in OR  Informed Consent: I have reviewed the patients History and Physical, chart, labs and discussed the procedure including the risks, benefits and alternatives for the proposed anesthesia with the patient or authorized representative who has indicated his/her understanding and acceptance.   Dental advisory given  Plan Discussed with: CRNA and Surgeon  Anesthesia Plan Comments:         Anesthesia Quick Evaluation

## 2015-07-23 NOTE — Addendum Note (Signed)
Addendum  created 07/23/15 1556 by Elyn PeersSandra J Ayman Brull, CRNA   Modules edited: Charges VN

## 2015-07-23 NOTE — Progress Notes (Addendum)
PROGRESS NOTE    Samuel Byrd  WRU:[id number] DOB: November 09, 1985 DOA: 07/22/2015 PCP: No primary care provider on file.none Outpatient Specialists: none   Brief Narrative: Samuel Byrd is a 30 y.o. male with no medical history who presents after a syncope episode. Patient was using the bathroom when he stand up , he felt dizzy and pass out. He hit his head with the floor. He think he was out for 5 minutes. Denies tongue bite , urinary incontinence. He is feeling lightheaded and dizziness, specially when he stands up. He report poor oral intake since yesterday and nausea. He has had abdominal pain on and off for 2 years. He was prescribe Pepcid to treat presume ulcer. His abdominal pain gets worse with spicy, fatty food. Patient was notice to have melena and occult blood positive on rectal exam perform by ED physician. Patient report black stool started this am. He report abdominal pain, nausea. No diarrhea. Maybe some weight loss per wife. Patient complaining of headaches. He denies chest pain or dyspnea.  He takes ibuprofen occasionally.    Assessment & Plan:   Active Problems:   Acute upper GI bleed   GI bleed   Acute blood loss anemia   Syncope and collapse  1-Melena, GI bleed;  IV fluids. IV protinix. Zofran PRN.  S/P one unit PRBC.  Hb stable at 9.  INR normal  S/P endoscopy which showed non bleeding gastric ulcer, and multiple non bleeding duodenal ulcer.   2-Anemia, acute blood loss; Presents with melena. Check anemia panel.  S/P   one unit PRBC transfusion , for symptomatic anemia.   3-Hyperbilirubinemia; anemia; indirect elevated. LDH normal less likely hemolysis, await haptoglobin level.  Repeat labs in am. Peripheral smear ordered. Maybe gilbert's   Syncope; Suspect related to hypotension, anemia. IV fluids, PRBC transfusion.  Monitor on telemetry.  ECHO pending  CT head negative.   4-Abnormal EKG; He denies chest pain or dyspnea. ECHO pending.  Family history;  mother died sudden death at 74.   troponin negative .   5-Screening for HIV. pending     DVT prophylaxis: scd Code Status: full code Family Communication: wife at bedside Disposition Plan: home when hb stable   Consultants:   GI   Procedures: Endoscopy; - Normal esophagus. - Non-bleeding gastric ulcers with no stigmata of bleeding. Biopsied- Multiple non-bleeding duodenal ulcers with no stigmata of bleeding.  Antimicrobials:   none   Subjective: Feeling ok, dizziness improved.   Objective: Filed Vitals:   07/23/15 0200 07/23/15 0300 07/23/15 0400 07/23/15 0500  BP: 97/62 104/59 100/58 102/58  Pulse: 66 57 69 68  Temp:   98.1 F (36.7 C)   TempSrc:   Oral   Resp: Height:      Weight:    57.2 kg (126 lb 1.7 oz)  SpO2: 100% 100% 100% 100%    Intake/Output Summary (Last 24 hours) at 07/23/15 0833 Last data filed at 07/22/15 2200  Gross per 24 hour  Intake    455 ml  Output      0 ml  Net    455 ml   Filed Weights   07/22/15 1532 07/23/15 0500  Weight: 58.968 kg (130 lb) 57.2 kg (126 lb 1.7 oz)    Examination:  General exam: Appears calm and comfortable  Respiratory system: Clear to auscultation. Respiratory effort normal. Cardiovascular system: S1 & S2 heard, RRR. No JVD, murmurs, rubs, gallops or clicks. No pedal edema. Gastrointestinal system: Abdomen is  nondistended, soft and nontender. No organomegaly or masses felt. Normal bowel sounds heard. Central nervous system: Alert and oriented. No focal neurological deficits. Extremities: Symmetric 5 x 5 power. Skin: No rashes, lesions or ulcers Psychiatry: Judgement and insight appear normal. Mood & affect appropriate.     Data Reviewed: I have personally reviewed following labs and imaging studies  CBC:  Recent Labs Lab 07/22/15 1629 07/22/15 1849 07/23/15 0246  WBC 4.8  --  5.0  HGB 9.3* 9.0* 9.8*  HCT 27.1* 26.0* 28.2*  MCV 76.6*  --  76.2*  PLT 178  --  142*   Basic  Metabolic Panel:  Recent Labs Lab 07/22/15 1629 07/23/15 0246  NA 134* 135  K 4.1 3.2*  CL 105 105  CO2 25 24  GLUCOSE 143* 87  BUN 39* 25*  CREATININE 0.88 0.82  CALCIUM 8.7* 8.3*   GFR: Estimated Creatinine Clearance: 104.6 mL/min (by C-G formula based on Cr of 0.82). Liver Function Tests:  Recent Labs Lab 07/22/15 1629 07/22/15 1835 07/23/15 0246  AST 19  --  19  ALT 15*  --  14*  ALKPHOS 35*  --  31*  BILITOT 1.7* 1.5* 2.7*  PROT 6.9  --  5.5*  ALBUMIN 3.7  --  3.2*   No results for input(s): LIPASE, AMYLASE in the last 168 hours. No results for input(s): AMMONIA in the last 168 hours. Coagulation Profile:  Recent Labs Lab 07/22/15 1835  INR 1.13   Cardiac Enzymes:  Recent Labs Lab 07/22/15 1835  TROPONINI <0.03   BNP (last 3 results) No results for input(s): PROBNP in the last 8760 hours. HbA1C: No results for input(s): HGBA1C in the last 72 hours. CBG:  Recent Labs Lab 07/22/15 1608  GLUCAP 111*   Lipid Profile: No results for input(s): CHOL, HDL, LDLCALC, TRIG, CHOLHDL, LDLDIRECT in the last 72 hours. Thyroid Function Tests: No results for input(s): TSH, T4TOTAL, FREET4, T3FREE, THYROIDAB in the last 72 hours. Anemia Panel:  Recent Labs  07/22/15 1835  VITAMINB12 1019*  FOLATE 16.1  FERRITIN 104  TIBC 335  IRON 205*  RETICCTPCT 1.5   Urine analysis:    Component Value Date/Time   COLORURINE YELLOW 07/22/2015 1757   APPEARANCEUR CLEAR 07/22/2015 1757   LABSPEC 1.028 07/22/2015 1757   PHURINE 6.0 07/22/2015 1757   GLUCOSEU NEGATIVE 07/22/2015 1757   HGBUR NEGATIVE 07/22/2015 1757   BILIRUBINUR NEGATIVE 07/22/2015 1757   KETONESUR NEGATIVE 07/22/2015 1757   PROTEINUR NEGATIVE 07/22/2015 1757   NITRITE NEGATIVE 07/22/2015 1757   LEUKOCYTESUR NEGATIVE 07/22/2015 1757   Sepsis Labs:  Recent Labs Lab 07/22/15 1634  LATICACIDVEN 0.80    No results found for this or any previous visit (from the past 240 hour(s)).        Radiology Studies: Ct Head Wo Contrast  07/22/2015  CLINICAL DATA:  Dizziness, headache EXAM: CT HEAD WITHOUT CONTRAST TECHNIQUE: Contiguous axial images were obtained from the base of the skull through the vertex without intravenous contrast. COMPARISON:  None. FINDINGS: There is no evidence of mass effect, midline shift or extra-axial fluid collections. There is no evidence of a space-occupying lesion or intracranial hemorrhage. There is no evidence of a cortical-based area of acute infarction. The ventricles and sulci are appropriate for the patient's age. The basal cisterns are patent. Visualized portions of the orbits are unremarkable. The visualized portions of the paranasal sinuses and mastoid air cells are unremarkable. The osseous structures are unremarkable. IMPRESSION: Normal CT of the brain without  intravenous contrast. Electronically Signed   By: Elige KoHetal  Patel   On: 07/22/2015 16:28   Dg Chest Port 1 View  07/22/2015  CLINICAL DATA:  Headache today; no chest complaints today; non smoker EXAM: PORTABLE CHEST 1 VIEW COMPARISON:  None. FINDINGS: The heart size and mediastinal contours are within normal limits. Both lungs are clear. The visualized skeletal structures are unremarkable. IMPRESSION: No active disease. Electronically Signed   By: Norva PavlovElizabeth  Brown M.D.   On: 07/22/2015 16:15        Scheduled Meds: . [START ON 07/26/2015] pantoprazole (PROTONIX) IV  40 mg Intravenous Q12H  . sodium chloride flush  3 mL Intravenous Q12H   Continuous Infusions: . sodium chloride 100 mL/hr at 07/23/15 0535  . sodium chloride 20 mL/hr at 07/23/15 0828  . pantoprozole (PROTONIX) infusion 8 mg/hr (07/23/15 0238)        Time spent: 35 minutes.     Alba Coryegalado, Kage Willmann A, MD [facility] Pager [phone]  If 7PM-7AM, please contact night-coverage [email or web address] Password [email or web address] 07/23/2015, 8:33 AM

## 2015-07-23 NOTE — H&P (View-Only) (Signed)
Consult Note for [facility]  Reason for Consult: Anemia, heme positive stool, and syncope Referring Physician: [facility]  Samuel Byrd HPI: This is a 30 year old male with a PMH of peptic ulcer who is admitted to the hospital after a syncopal episode.  He reports feeling unwell this morning and he passed out for approximately 5 minutes.  Afterwards he continued to feel dizzy and lightheaded.  These symptoms worsened with standing.  The patient reports having melena that started this morning.  In the past he was clinically diagnosed with PUD when he was living in [place] and Pepcid was prescribed, but no EGD was performed and he did not have melena.  Over the intervening two years he reports intermittent abdominal pain and nausea, however, these symptoms markedly worsened.  He does use ibuprofen occasionally and there is no history of ETOH.  In the ER his HGB was noted to be in the 9 range, but there were no baseline values for comparison.  A CT scan of his head was negative for any trauma.  The patient was noted to be hypotensive initially, but he responded well to IV hydration.    Past Medical History  Diagnosis Date  . Ulcer, stomach peptic     History reviewed. No pertinent past surgical history.  No family history on file.  Social History:  reports that he has never smoked. He does not have any smokeless tobacco history on file. He reports that he drinks alcohol. His drug history is not on file.  Allergies: No Known Allergies  Medications:  Scheduled: . sodium chloride   Intravenous Once  . [START ON 07/26/2015] pantoprazole (PROTONIX) IV  40 mg Intravenous Q12H  . sodium chloride flush  3 mL Intravenous Q12H   Continuous: . sodium chloride 100 mL/hr at 07/22/15 1845  . pantoprozole (PROTONIX) infusion      Results for orders placed or performed during the hospital encounter of 07/22/15 (from the past 24 hour(s))  CBG monitoring, ED     Status: Abnormal   Collection Time:  07/22/15  4:08 PM  Result Value Ref Range   Glucose-Capillary 111 (H) 65 - 99 mg/dL  CBC     Status: Abnormal   Collection Time: 07/22/15  4:29 PM  Result Value Ref Range   WBC 4.8 4.0 - 10.5 K/uL   RBC 3.54 (L) 4.22 - 5.81 MIL/uL   Hemoglobin 9.3 (L) 13.0 - 17.0 g/dL   HCT 27.1 (L) 39.0 - 52.0 %   MCV 76.6 (L) 78.0 - 100.0 fL   MCH 26.3 26.0 - 34.0 pg   MCHC 34.3 30.0 - 36.0 g/dL   RDW 13.1 11.5 - 15.5 %   Platelets 178 150 - 400 K/uL  Comprehensive metabolic panel     Status: Abnormal   Collection Time: 07/22/15  4:29 PM  Result Value Ref Range   Sodium 134 (L) 135 - 145 mmol/L   Potassium 4.1 3.5 - 5.1 mmol/L   Chloride 105 101 - 111 mmol/L   CO2 25 22 - 32 mmol/L   Glucose, Bld 143 (H) 65 - 99 mg/dL   BUN 39 (H) 6 - 20 mg/dL   Creatinine, Ser 0.88 0.61 - 1.24 mg/dL   Calcium 8.7 (L) 8.9 - 10.3 mg/dL   Total Protein 6.9 6.5 - 8.1 g/dL   Albumin 3.7 3.5 - 5.0 g/dL   AST 19 15 - 41 U/L   ALT 15 (L) 17 - 63 U/L   Alkaline Phosphatase   35 (L) 38 - 126 U/L   Total Bilirubin 1.7 (H) 0.3 - 1.2 mg/dL   GFR calc non Af Amer >60 >60 mL/min   GFR calc Af Amer >60 >60 mL/min   Anion gap 4 (L) 5 - 15  I-Stat CG4 Lactic Acid, ED     Status: None   Collection Time: 07/22/15  4:34 PM  Result Value Ref Range   Lactic Acid, Venous 0.80 0.5 - 2.0 mmol/L  Urinalysis, Routine w reflex microscopic     Status: None   Collection Time: 07/22/15  5:57 PM  Result Value Ref Range   Color, Urine YELLOW YELLOW   APPearance CLEAR CLEAR   Specific Gravity, Urine 1.028 1.005 - 1.030   pH 6.0 5.0 - 8.0   Glucose, UA NEGATIVE NEGATIVE mg/dL   Hgb urine dipstick NEGATIVE NEGATIVE   Bilirubin Urine NEGATIVE NEGATIVE   Ketones, ur NEGATIVE NEGATIVE mg/dL   Protein, ur NEGATIVE NEGATIVE mg/dL   Nitrite NEGATIVE NEGATIVE   Leukocytes, UA NEGATIVE NEGATIVE  Type and screen [facility]     Status: None (Preliminary result)   Collection Time: 07/22/15  6:03 PM  Result Value Ref Range    ABO/RH(D) O POS    Antibody Screen NEG    Sample Expiration 07/25/2015    Unit Number [id number]    Blood Component Type RED CELLS,LR    Unit division 00    Status of Unit ALLOCATED    Transfusion Status OK TO TRANSFUSE    Crossmatch Result Compatible   ABO/Rh     Status: None   Collection Time: 07/22/15  6:03 PM  Result Value Ref Range   ABO/RH(D) O POS   Reticulocytes     Status: Abnormal   Collection Time: 07/22/15  6:35 PM  Result Value Ref Range   Retic Ct Pct 1.5 0.4 - 3.1 %   RBC. 3.41 (L) 4.22 - 5.81 MIL/uL   Retic Count, Manual 51.2 19.0 - 186.0 K/uL  Bilirubin, fractionated(tot/dir/indir)     Status: Abnormal   Collection Time: 07/22/15  6:35 PM  Result Value Ref Range   Total Bilirubin 1.5 (H) 0.3 - 1.2 mg/dL   Bilirubin, Direct 0.2 0.1 - 0.5 mg/dL   Indirect Bilirubin 1.3 (H) 0.3 - 0.9 mg/dL  Prepare RBC     Status: None   Collection Time: 07/22/15  6:35 PM  Result Value Ref Range   Order Confirmation ORDER PROCESSED BY BLOOD BANK   Protime-INR     Status: None   Collection Time: 07/22/15  6:35 PM  Result Value Ref Range   Prothrombin Time 14.7 11.6 - 15.2 seconds   INR 1.13 0.00 - 1.49  Troponin I     Status: None   Collection Time: 07/22/15  6:35 PM  Result Value Ref Range   Troponin I <0.03 <0.031 ng/mL  Hemoglobin and hematocrit, blood     Status: Abnormal   Collection Time: 07/22/15  6:49 PM  Result Value Ref Range   Hemoglobin 9.0 (L) 13.0 - 17.0 g/dL   HCT 26.0 (L) 39.0 - 52.0 %     Ct Head Wo Contrast  07/22/2015  CLINICAL DATA:  Dizziness, headache EXAM: CT HEAD WITHOUT CONTRAST TECHNIQUE: Contiguous axial images were obtained from the base of the skull through the vertex without intravenous contrast. COMPARISON:  None. FINDINGS: There is no evidence of mass effect, midline shift or extra-axial fluid collections. There is no evidence of a space-occupying lesion or intracranial hemorrhage.   There is no evidence of a cortical-based area of acute  infarction. The ventricles and sulci are appropriate for the patient's age. The basal cisterns are patent. Visualized portions of the orbits are unremarkable. The visualized portions of the paranasal sinuses and mastoid air cells are unremarkable. The osseous structures are unremarkable. IMPRESSION: Normal CT of the brain without intravenous contrast. Electronically Signed   By: Hetal  Patel   On: 07/22/2015 16:28   Dg Chest Port 1 View  07/22/2015  CLINICAL DATA:  Headache today; no chest complaints today; non smoker EXAM: PORTABLE CHEST 1 VIEW COMPARISON:  None. FINDINGS: The heart size and mediastinal contours are within normal limits. Both lungs are clear. The visualized skeletal structures are unremarkable. IMPRESSION: No active disease. Electronically Signed   By: Elizabeth  Brown M.D.   On: 07/22/2015 16:15    ROS:  As stated above in the HPI otherwise negative.  Blood pressure 108/71, pulse 83, temperature 98.2 F (36.8 C), temperature source Oral, resp. rate 18, height 5' 7" (1.702 m), weight 58.968 kg (130 lb), SpO2 100 %.    PE: Gen: NAD, Alert and Oriented HEENT:  [place]/AT, EOMI Neck: Supple, no LAD Lungs: CTA Bilaterally CV: RRR without M/G/R ABM: Soft, minimal periumbilical pain, +BS Ext: No C/C/E  Assessment/Plan: 1) Probable upper GI bleed. 2) Anemia. 3) Melena/Heme positive stool.   With his presentation and EGD will be pursued.  He his hemodynamically stable at this time.  From his country of origin H. Pylori needs to be considered as a precipitating source of PUD, or at least a contributing factor.  Plan: 1) EGD tomorrow. 2) Agree with PPI. 3) Follow HGB and transfuse as necessary.  Oona Trammel D 07/22/2015, 7:52 PM      

## 2015-07-23 NOTE — Interval H&P Note (Signed)
History and Physical Interval Note:  07/23/2015 10:27 AM  [name]  has presented today for surgery, with the diagnosis of Upper GI bleed  The various methods of treatment have been discussed with the patient and family. After consideration of risks, benefits and other options for treatment, the patient has consented to  Procedure(s): ESOPHAGOGASTRODUODENOSCOPY (EGD) (N/A) as a surgical intervention .  The patient's history has been reviewed, patient examined, no change in status, stable for surgery.  I have reviewed the patient's chart and labs.  Questions were answered to the patient's satisfaction.     Charlie PitterHenry L Danis III

## 2015-07-23 NOTE — Progress Notes (Signed)
  Echocardiogram 2D Echocardiogram has been performed.  Nolon RodBrown, Tony 07/23/2015, 3:17 PM

## 2015-07-23 NOTE — Op Note (Signed)
[facility] Patient Name: Samuel Byrd Procedure Date: 07/23/2015 MRN: [id number] Attending MD: Starr Lake. Myrtie Neither , MD Date of Birth: 02/01/1986 CSN: [id number] Age: 30 Admit Type: Inpatient Procedure:                Upper GI endoscopy Indications:              Acute post hemorrhagic anemia, Melena Providers:                Sherilyn Cooter L. Myrtie Neither, MD, Anthony Sar, RN, Jacquiline Doe, RN, Harrington Challenger, Technician Referring MD:              Medicines:                Monitored Anesthesia Care Complications:            No immediate complications. Estimated Blood Loss:     Estimated blood loss: none. Estimated blood loss                            was minimal. Procedure:                Pre-Anesthesia Assessment:                           - Prior to the procedure, a History and Physical                            was performed, and patient medications and                            allergies were reviewed. The patient's tolerance of                            previous anesthesia was also reviewed. The risks                            and benefits of the procedure and the sedation                            options and risks were discussed with the patient.                            All questions were answered, and informed consent                            was obtained. Prior Anticoagulants: The patient has                            taken no previous anticoagulant or antiplatelet                            agents. ASA Grade Assessment: II - A patient with  mild systemic disease. After reviewing the risks                            and benefits, the patient was deemed in                            satisfactory condition to undergo the procedure.                           After obtaining informed consent, the endoscope was                            passed under direct vision. Throughout the                            procedure, the  patient's blood pressure, pulse, and                            oxygen saturations were monitored continuously. The                            Endosonoscope was introduced through the mouth, and                            advanced to the second part of duodenum. The upper                            GI endoscopy was accomplished without difficulty.                            The patient tolerated the procedure well. Scope In: Scope Out: Findings:      The esophagus was normal.      Few non-bleeding superficial gastric ulcers with no stigmata of bleeding       were found in the gastric antrum. The largest lesion was 4 mm in largest       dimension. Biopsies were taken with a cold forceps for histology.      Few non-bleeding cratered duodenal ulcers with no stigmata of bleeding       were found in the duodenal bulb. The largest lesion was 8 mm in largest       dimension. The adjacent tissue was friable.      The cardia and gastric fundus were normal on retroflexion. Impression:               - Normal esophagus.                           - Non-bleeding gastric ulcers with no stigmata of                            bleeding. Biopsied.                           - Multiple non-bleeding duodenal ulcers with no  stigmata of bleeding. Moderate Sedation:      MAC sedation used Recommendation:           - Await pathology results.                           - Use Protonix (pantoprazole) 40 mg PO BID.                           - Resume regular diet.                           - Await pathology results.                           - Avoid aspirin and NSAIDs                           - Resume regular diet.                           - Discontinue protonix drip Procedure Code(s):        --- Professional ---                           [id number], Esophagogastroduodenoscopy, flexible,                            transoral; with biopsy, single or multiple Diagnosis Code(s):        ---  Professional ---                           K25.9, Gastric ulcer, unspecified as acute or                            chronic, without hemorrhage or perforation                           K26.9, Duodenal ulcer, unspecified as acute or                            chronic, without hemorrhage or perforation                           D62, Acute posthemorrhagic anemia                           K92.1, Melena (includes Hematochezia) CPT copyright 2016 American Medical Association. All rights reserved. The codes documented in this report are preliminary and upon coder review may  be revised to meet current compliance requirements. Henry L. Myrtie Neitheranis, MD 07/23/2015 10:46:12 AM This report has been signed electronically. Number of Addenda: 0

## 2015-07-23 NOTE — Progress Notes (Signed)
Patient ID: Aylen Rambert, male   DOB: Jul 22, 1985, 30 y.o.   MRN: [id number]    Progress Note   Subjective   Feels Ok, no c/o pain, no stools or emesis since admit, wife at bedside HGB 9.8 stable  BP stable since arrival to unit   Objective   Vital signs in last 24 hours: Temp:  [98.1 F (36.7 C)-98.7 F (37.1 C)] 98.1 F (36.7 C) (05/15 0400) Pulse Rate:  [57-98] 68 (05/15 0500) Resp:  [11-24] 22 (05/15 0500) BP: (93-123)/(52-78) 102/58 mmHg (05/15 0500) SpO2:  [100 %] 100 % (05/15 0500) Weight:  [126 lb 1.7 oz (57.2 kg)-130 lb (58.968 kg)] 126 lb 1.7 oz (57.2 kg) (05/15 0500)   General: thin   AA male in NAD Heart:  Regular rate and rhythm; no murmurs Lungs: Respirations even and unlabored, lungs CTA bilaterally Abdomen:  Soft, nontender and nondistended. Normal bowel sounds. Extremities:  Without edema. Neurologic:  Alert and oriented,  grossly normal neurologically. Psych:  Cooperative. Normal mood and affect.  Intake/Output from previous day: 05/14 0701 - 05/15 0700 In: 455 [I.V.:125; Blood:330] Out: -  Intake/Output this shift:    Lab Results:  Recent Labs  07/22/15 1629 07/22/15 1849 07/23/15 0246  WBC 4.8  --  5.0  HGB 9.3* 9.0* 9.8*  HCT 27.1* 26.0* 28.2*  PLT 178  --  142*   BMET  Recent Labs  07/22/15 1629 07/23/15 0246  NA 134* 135  K 4.1 3.2*  CL 105 105  CO2 25 24  GLUCOSE 143* 87  BUN 39* 25*  CREATININE 0.88 0.82  CALCIUM 8.7* 8.3*   LFT  Recent Labs  07/22/15 1835 07/23/15 0246  PROT  --  5.5*  ALBUMIN  --  3.2*  AST  --  19  ALT  --  14*  ALKPHOS  --  31*  BILITOT 1.5* 2.7*  BILIDIR 0.2  --   IBILI 1.3*  --    PT/INR  Recent Labs  07/22/15 1835  LABPROT 14.7  INR 1.13    Studies/Results: Ct Head Wo Contrast  07/22/2015  CLINICAL DATA:  Dizziness, headache EXAM: CT HEAD WITHOUT CONTRAST TECHNIQUE: Contiguous axial images were obtained from the base of the skull through the vertex without intravenous contrast.  COMPARISON:  None. FINDINGS: There is no evidence of mass effect, midline shift or extra-axial fluid collections. There is no evidence of a space-occupying lesion or intracranial hemorrhage. There is no evidence of a cortical-based area of acute infarction. The ventricles and sulci are appropriate for the patient's age. The basal cisterns are patent. Visualized portions of the orbits are unremarkable. The visualized portions of the paranasal sinuses and mastoid air cells are unremarkable. The osseous structures are unremarkable. IMPRESSION: Normal CT of the brain without intravenous contrast. Electronically Signed   By: Elige Ko   On: 07/22/2015 16:28   Dg Chest Port 1 View  07/22/2015  CLINICAL DATA:  Headache today; no chest complaints today; non smoker EXAM: PORTABLE CHEST 1 VIEW COMPARISON:  None. FINDINGS: The heart size and mediastinal contours are within normal limits. Both lungs are clear. The visualized skeletal structures are unremarkable. IMPRESSION: No active disease. Electronically Signed   By: Norva Pavlov M.D.   On: 07/22/2015 16:15       Assessment / Plan:    #1 29 yo [place] male with acute GI bleed with syncope, melena yesterday morning - stable since admit , no active bleeding, and hgb remains in 9 range. 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

## 2015-07-23 NOTE — Transfer of Care (Signed)
Immediate Anesthesia Transfer of Care Note  Patient: Samuel Byrd  Procedure(s) Performed: Procedure(s): ESOPHAGOGASTRODUODENOSCOPY (EGD) (N/A)  Patient Location: PACU  Anesthesia Type:MAC  Level of Consciousness: sedated  Airway & Oxygen Therapy: Patient Spontanous Breathing and Patient connected to nasal cannula oxygen  Post-op Assessment: Report given to RN and Post -op Vital signs reviewed and stable  Post vital signs: Reviewed and stable  Last Vitals:  Filed Vitals:   07/23/15 0900 07/23/15 0949  BP: 108/59 125/65  Pulse: 84 76  Temp:    Resp: 16 12    Last Pain:  Filed Vitals:   07/23/15 0952  PainSc: 4          Complications: No apparent anesthesia complications

## 2015-07-24 ENCOUNTER — Telehealth: Payer: Self-pay

## 2015-07-24 ENCOUNTER — Encounter (HOSPITAL_COMMUNITY): Payer: Self-pay | Admitting: Gastroenterology

## 2015-07-24 DIAGNOSIS — D62 Acute posthemorrhagic anemia: Secondary | ICD-10-CM

## 2015-07-24 DIAGNOSIS — K264 Chronic or unspecified duodenal ulcer with hemorrhage: Secondary | ICD-10-CM

## 2015-07-24 DIAGNOSIS — K269 Duodenal ulcer, unspecified as acute or chronic, without hemorrhage or perforation: Secondary | ICD-10-CM | POA: Diagnosis not present

## 2015-07-24 DIAGNOSIS — K257 Chronic gastric ulcer without hemorrhage or perforation: Secondary | ICD-10-CM

## 2015-07-24 DIAGNOSIS — R55 Syncope and collapse: Secondary | ICD-10-CM | POA: Diagnosis not present

## 2015-07-24 DIAGNOSIS — K922 Gastrointestinal hemorrhage, unspecified: Secondary | ICD-10-CM

## 2015-07-24 DIAGNOSIS — K921 Melena: Secondary | ICD-10-CM | POA: Diagnosis not present

## 2015-07-24 LAB — CBC
HEMATOCRIT: 25.8 % — AB (ref 39.0–52.0)
HEMOGLOBIN: 8.7 g/dL — AB (ref 13.0–17.0)
MCH: 26 pg (ref 26.0–34.0)
MCHC: 33.7 g/dL (ref 30.0–36.0)
MCV: 77.2 fL — AB (ref 78.0–100.0)
Platelets: 135 10*3/uL — ABNORMAL LOW (ref 150–400)
RBC: 3.34 MIL/uL — ABNORMAL LOW (ref 4.22–5.81)
RDW: 14.1 % (ref 11.5–15.5)
WBC: 4.1 10*3/uL (ref 4.0–10.5)

## 2015-07-24 LAB — COMPREHENSIVE METABOLIC PANEL
ALBUMIN: 3.2 g/dL — AB (ref 3.5–5.0)
ALT: 12 U/L — ABNORMAL LOW (ref 17–63)
ANION GAP: 5 (ref 5–15)
AST: 17 U/L (ref 15–41)
Alkaline Phosphatase: 29 U/L — ABNORMAL LOW (ref 38–126)
BILIRUBIN TOTAL: 1.2 mg/dL (ref 0.3–1.2)
BUN: 18 mg/dL (ref 6–20)
CALCIUM: 8.2 mg/dL — AB (ref 8.9–10.3)
CO2: 26 mmol/L (ref 22–32)
Chloride: 107 mmol/L (ref 101–111)
Creatinine, Ser: 0.96 mg/dL (ref 0.61–1.24)
GFR calc Af Amer: >60 mL/min (ref >60)
GLUCOSE: 82 mg/dL (ref 65–99)
POTASSIUM: 3.9 mmol/L (ref 3.5–5.1)
Sodium: 138 mmol/L (ref 135–145)
TOTAL PROTEIN: 5.6 g/dL — AB (ref 6.5–8.1)

## 2015-07-24 LAB — HEMOGLOBIN AND HEMATOCRIT, BLOOD
HCT: 26.6 % — ABNORMAL LOW (ref 39.0–52.0)
HEMOGLOBIN: 9.1 g/dL — AB (ref 13.0–17.0)

## 2015-07-24 LAB — HAPTOGLOBIN: HAPTOGLOBIN: 82 mg/dL (ref 34–200)

## 2015-07-24 LAB — HIV ANTIBODY (ROUTINE TESTING W REFLEX): HIV Screen 4th Generation wRfx: NONREACTIVE

## 2015-07-24 MED ORDER — PANTOPRAZOLE SODIUM 40 MG PO TBEC: 40.0000 mg | DELAYED_RELEASE_TABLET | Freq: Two times a day (BID) | ORAL | Status: DC

## 2015-07-24 MED ORDER — FERROUS SULFATE 325 (65 FE) MG PO TBEC: 325.0000 mg | DELAYED_RELEASE_TABLET | Freq: Two times a day (BID) | ORAL | Status: DC

## 2015-07-24 NOTE — Progress Notes (Signed)
Pt called and given information [facility] and with Gastroenterologist (see AVS).   Delford FieldGagliano, Paiton Fosco E, RN

## 2015-07-24 NOTE — Telephone Encounter (Signed)
Message received from Lanier ClamKathy Mahabir, RN CM requesting a hospital follow up appointment for the [facility]. An appointment was scheduled for 07/26/15 @ 1430 and the information was placed on the AVS..   Update provided to K. Mahabir, RN CM

## 2015-07-24 NOTE — Progress Notes (Signed)
Patient ID: Samuel Byrd, male   DOB: 1986/02/21, 30 y.o.   MRN: [id number]    Progress Note   Subjective   Doing well, no further stools- says he feels fine, no c/o abdominal discomfort hgb 8.7 Path pending   Objective   Vital signs in last 24 hours: Temp:  [98.2 F (36.8 C)-98.6 F (37 C)] 98.2 F (36.8 C) (05/16 0537) Pulse Rate:  [67-152] 73 (05/16 0537) Resp:  [12-21] 18 (05/16 0537) BP: (98-137)/(40-82) 101/63 mmHg (05/16 0537) SpO2:  [97 %-100 %] 100 % (05/16 0537) Weight:  [122 lb 1.6 oz (55.384 kg)-127 lb (57.607 kg)] 122 lb 1.6 oz (55.384 kg) (05/16 0537) Last BM Date: 07/24/15 General:    AA male in NAD Heart:  Regular rate and rhythm; no murmurs Lungs: Respirations even and unlabored, lungs CTA bilaterally Abdomen:  Soft, nontender and nondistended. Normal bowel sounds. Extremities:  Without edema. Neurologic:  Alert and oriented,  grossly normal neurologically. Psych:  Cooperative. Normal mood and affect.  Intake/Output from previous day: 05/15 0701 - 05/16 0700 In: 640 [P.O.:240; I.V.:200; IV Piggyback:200] Out: -  Intake/Output this shift:    Lab Results:  Recent Labs  07/22/15 1629  07/23/15 0246 07/23/15 1152 07/24/15 0511  WBC 4.8  --  5.0  --  4.1  HGB 9.3*  < > 9.8* 9.0* 8.7*  HCT 27.1*  < > 28.2* 26.7* 25.8*  PLT 178  --  142*  --  135*  < > = values in this interval not displayed. BMET  Recent Labs  07/22/15 1629 07/23/15 0246 07/24/15 0511  NA 134* 135 138  K 4.1 3.2* 3.9  CL 105 105 107  CO2 GLUCOSE 143* 87 82  BUN 39* 25* 18  CREATININE 0.88 0.82 0.96  CALCIUM 8.7* 8.3* 8.2*   LFT  Recent Labs  07/22/15 1835  07/24/15 0511  PROT  --   < > 5.6*  ALBUMIN  --   < > 3.2*  AST  --   < > 17  ALT  --   < > 12*  ALKPHOS  --   < > 29*  BILITOT 1.5*  < > 1.2  BILIDIR 0.2  --   --   IBILI 1.3*  --   --   < > = values in this interval not displayed. PT/INR  Recent Labs  07/22/15 1835  LABPROT 14.7  INR 1.13     Studies/Results: Ct Head Wo Contrast  07/22/2015  CLINICAL DATA:  Dizziness, headache EXAM: CT HEAD WITHOUT CONTRAST TECHNIQUE: Contiguous axial images were obtained from the base of the skull through the vertex without intravenous contrast. COMPARISON:  None. FINDINGS: There is no evidence of mass effect, midline shift or extra-axial fluid collections. There is no evidence of a space-occupying lesion or intracranial hemorrhage. There is no evidence of a cortical-based area of acute infarction. The ventricles and sulci are appropriate for the patient's age. The basal cisterns are patent. Visualized portions of the orbits are unremarkable. The visualized portions of the paranasal sinuses and mastoid air cells are unremarkable. The osseous structures are unremarkable. IMPRESSION: Normal CT of the brain without intravenous contrast. Electronically Signed   By: Elige Ko   On: 07/22/2015 16:28   Dg Chest Port 1 View  07/22/2015  CLINICAL DATA:  Headache today; no chest complaints today; non smoker EXAM: PORTABLE CHEST 1 VIEW COMPARISON:  None. FINDINGS: The heart size and mediastinal contours are within normal limits. Both  lungs are clear. The visualized skeletal structures are unremarkable. IMPRESSION: No active disease. Electronically Signed   By: Norva PavlovElizabeth  Brown M.D.   On: 07/22/2015 16:15       Assessment / Plan:    #1 30 yo AA  Male with acute GI bleed secondary to gastric and duodenal ulcers all cleaned based Suspect NSAID induced , await path regarding hpylori Pt is stable, Ok for discharge home today if repeat hgb stable Will arrange  For follow up office visit in 2 weeks   Active Problems:   Acute upper GI bleed   GI bleed   Acute blood loss anemia   Syncope and collapse       Amy Esterwood  07/24/2015, 9:24 AM  I have reviewed the entire case in detail with the above APP and discussed the plan in detail.  Therefore, I agree with the diagnoses recorded above. In addition,   I have personally interviewed and examined the patient and have personally reviewed any abdominal/pelvic CT scan images.  My additional thoughts are as follows:  CC: melena and anemia  His bleeding has stopped.  H. Pylori came back NEGATIVE today. Avoid NSAIDs. 6 weeks BID PPI    Charlie PitterHenry L Danis III Pager [phone]  Mon-Fri 8a-5p [phone] after 5p, weekends, holidays

## 2015-07-24 NOTE — Discharge Summary (Signed)
Physician Discharge Summary  Samuel Byrd ZOX:[id number] DOB: May 26, 1985 DOA: 07/22/2015  PCP: No primary care provider on file.  Admit date: 07/22/2015 Discharge date: 07/24/2015  Time spent: 35 minutes  Recommendations for Outpatient Follow-up:  Needs CBC to follow Hb,  Follow biopsy results, H pylori results.   Discharge Diagnoses:    Acute upper GI bleed   GI bleed   Acute blood loss anemia   Syncope and collapse    non bleeding gastric ulcer, and multiple non bleeding duodenal ulcer.  Discharge Condition: stable  Diet recommendation: heart healthy   Filed Weights   07/23/15 0500 07/23/15 1758 07/24/15 0537  Weight: 57.2 kg (126 lb 1.7 oz) 57.607 kg (127 lb) 55.384 kg (122 lb 1.6 oz)    History of present illness:  Samuel Byrd is a 30 y.o. male with no medical history who presents after a syncope episode. Patient was using the bathroom when he stand up , he felt dizzy and pass out. He hit his head with the floor. He think he was out for 5 minutes. Denies tongue bite , urinary incontinence. He is feeling lightheaded and dizziness, specially when he stands up. He report poor oral intake since yesterday and nausea. He has had abdominal pain on and off for 2 years. He was prescribe Pepcid to treat presume ulcer. His abdominal pain gets worse with spicy, fatty food. Patient was notice to have melena and occult blood positive on rectal exam perform by ED physician. Patient report black stool started this am. He report abdominal pain, nausea. No diarrhea. Maybe some weight loss per wife. Patient complaining of headaches. He denies chest pain or dyspnea.  He takes ibuprofen occasionally.   Hospital Course:  1-Melena, GI bleed; IV fluids. IV protinix. Zofran PRN.  S/P one unit PRBC.  Hb stable at 9.  INR normal  S/P endoscopy which showed non bleeding gastric ulcer, and multiple non bleeding duodenal ulcer.  Discharge on Protonix BID. Follow up with GI for Biopsy results    2-Anemia, acute blood loss; Presents with melena. Check anemia panel.  S/P one unit PRBC transfusion , for symptomatic anemia.  Hb stable at 9. He is asymptomatics. Denies dizziness  3-Hyperbilirubinemia; anemia; indirect elevated. LDH normal less likely hemolysis, haptoglobin level normal . Maybe gilbert's diseases. Bilirubin normalized.   Syncope; Suspect related to hypotension, anemia. IV fluids, PRBC transfusion.  Monitor on telemetry.  ECHO normal  CT head negative.   4-Abnormal EKG; He denies chest pain or dyspnea. ECHO normal  Family history; mother died sudden death at 66.  troponin negative .   5-Screening for HIV negative  Procedures: Endoscopy: 5-15- Normal esophagus. - Non-bleeding gastric ulcers with no stigmata of  bleeding. Biopsied.  - Multiple non-bleeding duodenal ulcers with no  stigmata of bleeding.  Consultations:  GI, Dr Amada Jupiter  Discharge Exam: Filed Vitals:   07/24/15 0537 07/24/15 1239  BP: 101/63 106/58  Pulse: 73 86  Temp: 98.2 F (36.8 C) 98.3 F (36.8 C)  Resp: 18 19    General: NAD Cardiovascular: S 1, S 2 RRR Respiratory: CTA  Discharge Instructions   Discharge Instructions    Diet general    Complete by:  As directed      Increase activity slowly    Complete by:  As directed           Current Discharge Medication List    START taking these medications   Details  ferrous sulfate 325 (65 FE) MG EC tablet  Take 1 tablet (325 mg total) by mouth 2 (two) times daily with a meal. Qty: 60 tablet, Refills: 0    pantoprazole (PROTONIX) 40 MG tablet Take 1 tablet (40 mg total) by mouth 2 (two) times daily. Qty: 60 tablet, Refills: 0      STOP taking these medications     famotidine (PEPCID AC) 10 MG chewable tablet        No Known Allergies    The results of significant diagnostics from this hospitalization (including imaging, microbiology, ancillary and laboratory) are listed below for reference.     Significant Diagnostic Studies: Ct Head Wo Contrast  07/22/2015  CLINICAL DATA:  Dizziness, headache EXAM: CT HEAD WITHOUT CONTRAST TECHNIQUE: Contiguous axial images were obtained from the base of the skull through the vertex without intravenous contrast. COMPARISON:  None. FINDINGS: There is no evidence of mass effect, midline shift or extra-axial fluid collections. There is no evidence of a space-occupying lesion or intracranial hemorrhage. There is no evidence of a cortical-based area of acute infarction. The ventricles and sulci are appropriate for the patient's age. The basal cisterns are patent. Visualized portions of the orbits are unremarkable. The visualized portions of the paranasal sinuses and mastoid air cells are unremarkable. The osseous structures are unremarkable. IMPRESSION: Normal CT of the brain without intravenous contrast. Electronically Signed   By: Elige KoHetal  Patel   On: 07/22/2015 16:28   Dg Chest Port 1 View  07/22/2015  CLINICAL DATA:  Headache today; no chest complaints today; non smoker EXAM: PORTABLE CHEST 1 VIEW COMPARISON:  None. FINDINGS: The heart size and mediastinal contours are within normal limits. Both lungs are clear. The visualized skeletal structures are unremarkable. IMPRESSION: No active disease. Electronically Signed   By: Norva PavlovElizabeth  Brown M.D.   On: 07/22/2015 16:15    Microbiology: Recent Results (from the past 240 hour(s))  MRSA PCR Screening     Status: Abnormal   Collection Time: 07/22/15  7:00 PM  Result Value Ref Range Status   MRSA by PCR INVALID RESULTS, SPECIMEN SENT FOR CULTURE (A) NEGATIVE Final    Comment: NOTIFIED B. THACKER RN AT 1017 ON 05.15.17 BY SHUEA        The GeneXpert MRSA Assay (FDA approved for NASAL specimens only), is one component of a comprehensive MRSA colonization surveillance program. It is not intended to diagnose MRSA infection nor to guide or monitor treatment for MRSA infections.      Labs: Basic Metabolic  Panel:  Recent Labs Lab 07/22/15 1629 07/23/15 0246 07/24/15 0511  NA 134* 135 138  K 4.1 3.2* 3.9  CL 105 105 107  CO2 25 24 26   GLUCOSE 143* 87 82  BUN 39* 25* 18  CREATININE 0.88 0.82 0.96  CALCIUM 8.7* 8.3* 8.2*   Liver Function Tests:  Recent Labs Lab 07/22/15 1629 07/22/15 1835 07/23/15 0246 07/24/15 0511  AST 19  --  19 17  ALT 15*  --  14* 12*  ALKPHOS 35*  --  31* 29*  BILITOT 1.7* 1.5* 2.7* 1.2  PROT 6.9  --  5.5* 5.6*  ALBUMIN 3.7  --  3.2* 3.2*   No results for input(s): LIPASE, AMYLASE in the last 168 hours. No results for input(s): AMMONIA in the last 168 hours. CBC:  Recent Labs Lab 07/22/15 1629 07/22/15 1849 07/23/15 0246 07/23/15 1152 07/24/15 0511 07/24/15 1250  WBC 4.8  --  5.0  --  4.1  --   HGB 9.3* 9.0* 9.8* 9.0* 8.7* 9.1*  HCT 27.1* 26.0* 28.2* 26.7* 25.8* 26.6*  MCV 76.6*  --  76.2*  --  77.2*  --   PLT 178  --  142*  --  135*  --    Cardiac Enzymes:  Recent Labs Lab 07/22/15 1835  TROPONINI <0.03   BNP: BNP (last 3 results) No results for input(s): BNP in the last 8760 hours.  ProBNP (last 3 results) No results for input(s): PROBNP in the last 8760 hours.  CBG:  Recent Labs Lab 07/22/15 1608  GLUCAP 111*       Signed:  Hartley Barefoot A MD.  [facility] 07/24/2015, 1:06 PM

## 2015-07-24 NOTE — Care Management Note (Signed)
Case Management Note  Patient Details  Name: West BaliKenneth Trostle MRN: [id number] Date of Birth: 07/12/1985  Subjective/Objective: Referral for pcp & cbc in 1week. Patient had already chosen a pcp from his insurance co, but appt is 1 month away. He [facility]/u [facility]t & have blood work, I have also left message w/transitional care liason Erskine SquibbJane to check for pcp appt, & cbc in 1 week-which ever comes first since patient is d/c. Patient voiced understanding.                    Action/Plan:d/c plan home.   Expected Discharge Date:                  Expected Discharge Plan:  Home/Self Care  In-House Referral:     Discharge planning Services  CM Consult, [facility]  Post Acute Care Choice:    Choice offered to:     DME Arranged:    DME Agency:     HH Arranged:    HH Agency:     Status of Service:  Completed, signed off  Medicare Important Message Given:    Date Medicare IM Given:    Medicare IM give by:    Date Additional Medicare IM Given:    Additional Medicare Important Message give by:     If discussed at Long Length of Stay Meetings, dates discussed:    Additional Comments:  Lanier ClamMahabir, Jing Howatt, RN 07/24/2015, 1:41 PM

## 2015-07-24 NOTE — Care Management Note (Signed)
Case Management Note  Patient Details  Name: Samuel Byrd MRN: [id number] Date of Birth: 02/07/1986  Subjective/Objective: 30 y/o m admitted w/GIB. GI cons-path pend, 106/58, p 152,hgb-8.7, h/h, ekg.From home.                   Action/Plan:d/c plan home.   Expected Discharge Date:                  Expected Discharge Plan:  Home/Self Care  In-House Referral:     Discharge planning Services  CM Consult  Post Acute Care Choice:    Choice offered to:     DME Arranged:    DME Agency:     HH Arranged:    HH Agency:     Status of Service:  In process, will continue to follow  Medicare Important Message Given:    Date Medicare IM Given:    Medicare IM give by:    Date Additional Medicare IM Given:    Additional Medicare Important Message give by:     If discussed at Long Length of Stay Meetings, dates discussed:    Additional Comments:  Lanier ClamMahabir, Nadina Fomby, RN 07/24/2015, 12:45 PM

## 2015-07-25 LAB — MRSA CULTURE

## 2015-07-26 ENCOUNTER — Ambulatory Visit: Payer: Federal, State, Local not specified - PPO | Attending: Physician Assistant | Admitting: Physician Assistant

## 2015-07-26 ENCOUNTER — Encounter: Payer: Self-pay | Admitting: Physician Assistant

## 2015-07-26 VITALS — BP 118/67 | HR 88 | Temp 98.4°F | Wt 128.6 lb

## 2015-07-26 DIAGNOSIS — Z79899 Other long term (current) drug therapy: Secondary | ICD-10-CM | POA: Insufficient documentation

## 2015-07-26 DIAGNOSIS — D62 Acute posthemorrhagic anemia: Secondary | ICD-10-CM | POA: Diagnosis not present

## 2015-07-26 DIAGNOSIS — K922 Gastrointestinal hemorrhage, unspecified: Secondary | ICD-10-CM | POA: Diagnosis not present

## 2015-07-26 LAB — CBC WITH DIFFERENTIAL/PLATELET
BASOS ABS: 0 {cells}/uL (ref 0–200)
Basophils Relative: 0 %
EOS PCT: 2 %
Eosinophils Absolute: 94 cells/uL (ref 15–500)
HCT: 29 % — ABNORMAL LOW (ref 38.5–50.0)
Hemoglobin: 9.4 g/dL — ABNORMAL LOW (ref 13.2–17.1)
LYMPHS PCT: 39 %
Lymphs Abs: 1833 cells/uL (ref 850–3900)
MCH: 25.7 pg — AB (ref 27.0–33.0)
MCHC: 32.4 g/dL (ref 32.0–36.0)
MCV: 79.2 fL — ABNORMAL LOW (ref 80.0–100.0)
MONOS PCT: 7 %
MPV: 11.1 fL (ref 7.5–12.5)
Monocytes Absolute: 329 cells/uL (ref 200–950)
NEUTROS PCT: 52 %
Neutro Abs: 2444 cells/uL (ref 1500–7800)
Platelets: 196 10*3/uL (ref 140–400)
RBC: 3.66 MIL/uL — ABNORMAL LOW (ref 4.20–5.80)
RDW: 15.7 % — ABNORMAL HIGH (ref 11.0–15.0)
WBC: 4.7 10*3/uL (ref 3.8–10.8)

## 2015-07-26 MED ORDER — PANTOPRAZOLE SODIUM 40 MG PO TBEC: 40.0000 mg | DELAYED_RELEASE_TABLET | Freq: Two times a day (BID) | ORAL | Status: DC

## 2015-07-26 MED ORDER — FERROUS SULFATE 325 (65 FE) MG PO TBEC: 325.0000 mg | DELAYED_RELEASE_TABLET | Freq: Two times a day (BID) | ORAL | Status: DC

## 2015-07-26 NOTE — Patient Instructions (Signed)

## 2015-07-26 NOTE — Progress Notes (Signed)
Patient ID: Samuel BaliKenneth Torr, male   DOB: 06/05/1985, 30 y.o.   MRN: [id number]   Samuel BaliKenneth Diers, is a 30 y.o. male  [id number]  [id number]  DOB - 03/15/1985  Chief Complaint  Patient presents with  . Hospitalization Follow-up    GI Bleed        Subjective:  Chief Complaint and HPI: Samuel Byrd is a 30 y.o. male here today to establish care and for a follow up visit after a recent hospitalization for syncopal episode and low Hgb/?upper GI bleed and melena.  He was in the hospital 07/22/2015-07/24/2015. Echo and EKG were WNL.  He had blood transfusion and Hgb was up to 9.1 upon discharge.  He had upper endoscopy with Dr. Deatra CanterHenry Davis III that revealed Chronic inactive gastritis, non-bleeding ulcers, negative biopsy for h. Pylori, and biopsies were also negative for malignancy. He was not told to follow-up with GI and an appointment was not made.  He denies abdominal pain, SOB, CP.  No further episodes of melena.  He denies dizziness and no further syncopal episodes.  He is taking pantoprazole and ferrous sulfate bid.  He is feeling good overall.   ED/Hospital notes reviewed.    ROS:   Constitutional:  No f/c, No night sweats, No unexplained weight loss. EENT:  No vision changes, No blurry vision, No hearing changes. No mouth, throat, or ear problems.  Respiratory: No cough, No SOB Cardiac: No CP, no palpitations GI:  No abd pain, No N/V/D. GU: No Urinary s/sx Musculoskeletal: No joint pain Neuro: No headache, no dizziness, no motor weakness. No syncope Skin: No rash Endocrine:  No polydipsia. No polyuria.  Psych: Denies SI/HI  No problems updated.  ALLERGIES: No Known Allergies  PAST MEDICAL HISTORY: Past Medical History  Diagnosis Date  . Ulcer, stomach peptic     MEDICATIONS AT HOME: Prior to Admission medications   Medication Sig Start Date End Date Taking? Authorizing Provider  ferrous sulfate 325 (65 FE) MG EC tablet Take 1 tablet (325 mg total) by mouth 2 (two) times  daily with a meal. 07/26/15  Yes Marzella SchleinAngela M Miquel Stacks, PA-C  pantoprazole (PROTONIX) 40 MG tablet Take 1 tablet (40 mg total) by mouth 2 (two) times daily. 07/26/15  Yes Anders SimmondsAngela M Annasophia Crocker, PA-C     Objective:  EXAM[facility]easar Mons:   Filed Vitals:   07/26/15 1459  BP: 118/67  Pulse: 88  Temp: 98.4 F (36.9 C)  TempSrc: Oral  Weight: 128 lb 9.6 oz (58.333 kg)    General appearance : A&OX3. NAD. Non-toxic-appearing HEENT: Atraumatic and Normocephalic.  PERRLA. EOM intact.  TM clear B. Mouth-MMM, post pharynx WNL w/o erythema, No PND. Neck: supple, no JVD. No cervical lymphadenopathy. No thyromegaly Chest/Lungs:  Breathing-non-labored, Good air entry bilaterally, breath sounds normal without rales, rhonchi, or wheezing  CVS: S1 S2 regular, no murmurs, gallops, rubs  Abdomen: Bowel sounds present, Non tender and not distended with no gaurding, rigidity or rebound. Extremities: Bilateral Lower Ext shows no edema, both legs are warm to touch with = pulse throughout Neurology:  CN II-XII grossly intact, Non focal.   Psych:  TP linear. J/I WNL. Normal speech. Appropriate eye contact and affect.  Skin:  No Rash   Assessment & Plan   1. Acute upper GI bleed - ferrous sulfate 325 (65 FE) MG EC tablet; Take 1 tablet (325 mg total) by mouth 2 (two) times daily with a meal.  Dispense: 60 tablet; Refill: 1 - CBC with Differential/Platelet Refer to GI-Dr Danis(saw  him in the hospital)  2. Acute blood loss anemia - ferrous sulfate 325 (65 FE) MG EC tablet; Take 1 tablet (325 mg total) by mouth 2 (two) times daily with a meal.  Dispense: 60 tablet; Refill: 1 - CBC with Differential/Platelet  Patient have been counseled extensively about nutrition and exercise  Return in about 2 months (around 09/25/2015) for anemia f/up s/p GI bleed.   The patient was given clear instructions to go to ER or return to medical center if symptoms don't improve, worsen or new problems develop. The patient verbalized understanding.  The patient was told to call to get lab results if they haven't heard anything in the next week.     Georgian Co, PA-C [facility] [place], [place] [phone]   07/26/2015, 4:15 PM

## 2015-07-27 ENCOUNTER — Encounter: Payer: Self-pay | Admitting: Gastroenterology

## 2015-08-31 ENCOUNTER — Encounter: Payer: Self-pay | Admitting: Gastroenterology

## 2015-08-31 ENCOUNTER — Ambulatory Visit (INDEPENDENT_AMBULATORY_CARE_PROVIDER_SITE_OTHER): Payer: Federal, State, Local not specified - PPO | Admitting: Gastroenterology

## 2015-08-31 ENCOUNTER — Other Ambulatory Visit (INDEPENDENT_AMBULATORY_CARE_PROVIDER_SITE_OTHER): Payer: Federal, State, Local not specified - PPO

## 2015-08-31 VITALS — BP 92/60 | HR 66 | Ht 67.0 in | Wt 129.2 lb

## 2015-08-31 DIAGNOSIS — K264 Chronic or unspecified duodenal ulcer with hemorrhage: Secondary | ICD-10-CM

## 2015-08-31 DIAGNOSIS — D62 Acute posthemorrhagic anemia: Secondary | ICD-10-CM

## 2015-08-31 LAB — CBC WITH DIFFERENTIAL/PLATELET
BASOS PCT: 0.3 % (ref 0.0–3.0)
Basophils Absolute: 0 10*3/uL (ref 0.0–0.1)
EOS ABS: 0 10*3/uL (ref 0.0–0.7)
Eosinophils Relative: 1 % (ref 0.0–5.0)
HEMATOCRIT: 39.3 % (ref 39.0–52.0)
HEMOGLOBIN: 12.9 g/dL — AB (ref 13.0–17.0)
Lymphocytes Relative: 34.3 % (ref 12.0–46.0)
Lymphs Abs: 1.2 10*3/uL (ref 0.7–4.0)
MCHC: 32.8 g/dL (ref 30.0–36.0)
MCV: 82 fl (ref 78.0–100.0)
MONO ABS: 0.4 10*3/uL (ref 0.1–1.0)
MONOS PCT: 10.8 % (ref 3.0–12.0)
Neutro Abs: 1.8 10*3/uL (ref 1.4–7.7)
Neutrophils Relative %: 53.6 % (ref 43.0–77.0)
Platelets: 166 10*3/uL (ref 150.0–400.0)
RBC: 4.79 Mil/uL (ref 4.22–5.81)
RDW: 13.1 % (ref 11.5–15.5)
WBC: 3.4 10*3/uL — ABNORMAL LOW (ref 4.0–10.5)

## 2015-08-31 NOTE — Patient Instructions (Signed)
If you are age 32 or older, your body mass index should be between 23-30. Your Body mass index is 20.23 kg/(m^2). If this is out of the aforementioned range listed, please consider follow up with your Primary Care Provider.  If you are age 32 or younger, your body mass index should be between 19-25. Your Body mass index is 20.23 kg/(m^2). If this is out of the aformentioned range listed, please consider follow up with your Primary Care Provider.   Your physician has requested that you go to the basement for the following lab work before leaving today: CBC  Thank you for choosing [facility]  Dr Amada JupiterHenry Danis III

## 2015-08-31 NOTE — Progress Notes (Signed)
[facility] Progress Note  Chief Complaint: Duodenal ulcer  Subjective History:   Hospital  EGD 5/15 - Du from NSAID.  HP neg Bx Stool black -on iron Last Hgb 9.4 on 5/18 Appetite down, prob no wt loss by his report  ROS: Cardiovascular:  no chest pain Respiratory: no dyspnea  The patient's Past Medical, Family and Social History were reviewed and are on file in the EMR.  Objective:  Med list reviewed  Vital signs in last 24 hrs: Filed Vitals:   08/31/15 1443  BP: 92/60  Pulse: 66    Physical Exam   HEENT: sclera anicteric, oral mucosa moist without lesions  Neck: supple, no thyromegaly, JVD or lymphadenopathy  Cardiac: RRR without murmurs, S1S2 heard, no peripheral edema  Pulm: clear to auscultation bilaterally, normal RR and effort noted  Abdomen: soft, no tenderness, with active bowel sounds. No guarding or palpable hepatosplenomegaly.  Skin; warm and dry, no jaundice or rash Rectal: stool brown, heme negative Recent Labs:  CBC    Component Value Date/Time   WBC 4.7 07/26/2015 1523   RBC 3.66* 07/26/2015 1523   RBC 3.41* 07/22/2015 1835   HGB 9.4* 07/26/2015 1523   HCT 29.0* 07/26/2015 1523   PLT 196 07/26/2015 1523   MCV 79.2* 07/26/2015 1523   MCH 25.7* 07/26/2015 1523   MCHC 32.4 07/26/2015 1523   RDW 15.7* 07/26/2015 1523   LYMPHSABS 1833 07/26/2015 1523   MONOABS 329 07/26/2015 1523   EOSABS 94 07/26/2015 1523   BASOSABS 0 07/26/2015 1523     @ASSESSMENTPLANBEGIN @ Assessment: Encounter Diagnoses  Name Primary?  . Duodenal ulcer with hemorrhage Yes  . Acute blood loss anemia      Plan: Check CBC.  If anemia resolved, stop iron and complete total 6 weeks Protonix   Charlie PitterHenry L Danis III

## 2015-08-31 NOTE — Addendum Note (Signed)
Addended by: Alexis FrockDD, Shanika Levings V on: 08/31/2015 03:20 PM   Modules accepted: Orders

## 2015-10-01 ENCOUNTER — Telehealth: Payer: Self-pay | Admitting: Gastroenterology

## 2015-10-01 NOTE — Telephone Encounter (Signed)
Per Dr Myrtie Neither:  Pt notified of normal labs and also to stop iron and pantoprazole to be stopped in 1 week.    Notes Recorded by Charlie Pitter III, MD on 08/31/2015 at 5:00 PM His blood count has returned to normal, so he can stop the iron. Please take the pantoprazole once daily for another week, then it can also be stopped.

## 2016-03-12 ENCOUNTER — Encounter (HOSPITAL_COMMUNITY): Payer: Self-pay

## 2016-03-12 ENCOUNTER — Emergency Department (HOSPITAL_COMMUNITY)
Admission: EM | Admit: 2016-03-12 | Discharge: 2016-03-12 | Disposition: A | Discharge status: Home / Self Care | Payer: Federal, State, Local not specified - PPO | Attending: Emergency Medicine | Admitting: Emergency Medicine

## 2016-03-12 DIAGNOSIS — M542 Cervicalgia: Secondary | ICD-10-CM | POA: Diagnosis not present

## 2016-03-12 DIAGNOSIS — Y999 Unspecified external cause status: Secondary | ICD-10-CM | POA: Diagnosis not present

## 2016-03-12 DIAGNOSIS — Y92009 Unspecified place in unspecified non-institutional (private) residence as the place of occurrence of the external cause: Secondary | ICD-10-CM | POA: Insufficient documentation

## 2016-03-12 DIAGNOSIS — Y939 Activity, unspecified: Secondary | ICD-10-CM | POA: Insufficient documentation

## 2016-03-12 MED ORDER — NAPROXEN 500 MG PO TABS
500.0000 mg | ORAL_TABLET | Freq: Once | ORAL | Status: AC
  Administered 2016-03-12: 500 mg via ORAL
  Filled 2016-03-12: qty 1

## 2016-03-12 MED ORDER — ACETAMINOPHEN 500 MG PO TABS
1000.0000 mg | ORAL_TABLET | Freq: Once | ORAL | Status: AC
  Administered 2016-03-12: 1000 mg via ORAL
  Filled 2016-03-12: qty 2

## 2016-03-12 MED ORDER — NAPROXEN 500 MG PO TABS: 500.0000 mg | ORAL_TABLET | Freq: Two times a day (BID) | ORAL | 0 refills | Status: DC

## 2016-03-12 NOTE — ED Notes (Signed)
PT DISCHARGED. INSTRUCTIONS AND PRESCRIPTION GIVEN. AAOX4. PT IN NO APPARENT DISTRESS. THE OPPORTUNITY TO ASK QUESTIONS WAS PROVIDED. 

## 2016-03-12 NOTE — ED Notes (Signed)
This Clinical research associatewriter offered to obtain off-duty [employer] for Pt to file a complaint.  Pt is currently refusing.
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
## 2016-03-12 NOTE — Discharge Instructions (Signed)
Take Naprosyn twice daily for pain. If needed, you may take 1000 mg of Tylenol every 6 hours for pain. Follow-up with your primary care physician. Return to the ED for choking, difficulty swallowing, drooling, or any new or concerning symptoms.

## 2016-10-09 ENCOUNTER — Encounter: Payer: Self-pay | Admitting: Emergency Medicine

## 2016-10-09 ENCOUNTER — Ambulatory Visit (INDEPENDENT_AMBULATORY_CARE_PROVIDER_SITE_OTHER): Payer: Managed Care, Other (non HMO) | Admitting: Emergency Medicine

## 2016-10-09 VITALS — BP 105/64 | HR 80 | Temp 99.1°F | Resp 16 | Ht 67.0 in | Wt 126.0 lb

## 2016-10-09 DIAGNOSIS — R1013 Epigastric pain: Secondary | ICD-10-CM

## 2016-10-09 DIAGNOSIS — K279 Peptic ulcer, site unspecified, unspecified as acute or chronic, without hemorrhage or perforation: Secondary | ICD-10-CM | POA: Diagnosis not present

## 2016-10-09 MED ORDER — PANTOPRAZOLE SODIUM 40 MG PO TBEC: 40.0000 mg | DELAYED_RELEASE_TABLET | Freq: Every day | ORAL | 3 refills | Status: DC

## 2016-10-09 NOTE — Progress Notes (Signed)
[name] 31 y.o.   Chief Complaint  Patient presents with  . Establish Care  . Abdominal Pain    per patient he was DX with stomach ulcer 07/2015    HISTORY OF PRESENT ILLNESS: This is a 31 y.o. male with h/o PUD last year; had upper endoscopy and was treated with PPI twice a day; has been off medication for a while; started having epigastric pain intermittently for the past week; denies rectal bleeding or melena; no red flag symptoms. Here today to establish care.   HPI   Prior to Admission medications   Medication Sig Start Date End Date Taking? Authorizing Provider  ferrous sulfate 325 (65 FE) MG EC tablet Take 1 tablet (325 mg total) by mouth 2 (two) times daily with a meal. 07/26/15  Yes McClung, Angela M, PA-C  pantoprazole (PROTONIX) 40 MG tablet Take 1 tablet (40 mg total) by mouth 2 (two) times daily. 07/26/15  Yes Georgian CoMcClung, Angela M, PA-C  naproxen (NAPROSYN) 500 MG tablet Take 1 tablet (500 mg total) by mouth 2 (two) times daily. Patient not taking: Reported on 10/09/2016 03/12/16   Cheri Fowlerose, Kayla, PA-C    No Known Allergies  Patient Active Problem List   Diagnosis Date Noted  . Acute upper GI bleed 07/22/2015  . GI bleed 07/22/2015  . Acute blood loss anemia 07/22/2015  . Syncope and collapse 07/22/2015    Past Medical History:  Diagnosis Date  . Ulcer, stomach peptic     Past Surgical History:  Procedure Laterality Date  . ESOPHAGOGASTRODUODENOSCOPY N/A 07/23/2015   Procedure: ESOPHAGOGASTRODUODENOSCOPY (EGD);  Surgeon: Sherrilyn RistHenry L Danis III, MD;  [facility];  Service: Endoscopy;  Laterality: N/A;    Social History   Social History  . Marital status: Married    Spouse name: N/A  . Number of children: 0  . Years of education: N/A   Occupational History  . [employer]ing company    Social History Main Topics  . Smoking status: Never Smoker  . Smokeless tobacco: Never Used  . Alcohol use 0.0 oz/week     Comment: occasionally  . Drug use: No  .  Sexual activity: Not on file   Other Topics Concern  . Not on file   Social History Narrative  . No narrative on file    Family History  Problem Relation Age of Onset  . Heart attack Mother   . Ulcers Brother   . Colon cancer Neg Hx      Review of Systems  Constitutional: Negative.  Negative for chills, fever and malaise/fatigue.  HENT: Negative.  Negative for nosebleeds, sinus pain and sore throat.   Eyes: Negative.   Respiratory: Negative for cough, hemoptysis, shortness of breath and wheezing.   Cardiovascular: Negative for chest pain, palpitations and leg swelling.  Gastrointestinal: Positive for abdominal pain. Negative for blood in stool, diarrhea, heartburn, melena, nausea and vomiting.  Genitourinary: Negative for dysuria and hematuria.  Musculoskeletal: Negative for back pain, myalgias and neck pain.  Skin: Negative.  Negative for rash.  Neurological: Negative.  Negative for dizziness, sensory change, focal weakness, weakness and headaches.  Endo/Heme/Allergies: Negative.   All other systems reviewed and are negative.  Vitals:   10/09/16 0950  BP: 105/64  Pulse: 80  Resp: 16  Temp: 99.1 F (37.3 C)     Physical Exam  Constitutional: He is oriented to person, place, and time. He appears well-developed and well-nourished.  HENT:  Head: Normocephalic and atraumatic.  Right Ear: External  ear normal.  Left Ear: External ear normal.  Mouth/Throat: Oropharynx is clear and moist.  Eyes: Pupils are equal, round, and reactive to light. Conjunctivae and EOM are normal.  Neck: Normal range of motion. Neck supple. No JVD present. No thyromegaly present.  Cardiovascular: Normal rate, regular rhythm, normal heart sounds and intact distal pulses.   Pulmonary/Chest: Effort normal and breath sounds normal.  Abdominal: Soft. He exhibits no distension and no mass. There is tenderness (mils epigastric). There is no rebound and no guarding.  Musculoskeletal: Normal range of  motion.  Lymphadenopathy:    He has no cervical adenopathy.  Neurological: He is alert and oriented to person, place, and time. No sensory deficit. He exhibits normal muscle tone.  Skin: Skin is warm and dry. Capillary refill takes less than 2 seconds. No rash noted.  Psychiatric: He has a normal mood and affect. His behavior is normal.  Vitals reviewed.    ASSESSMENT & PLAN: [name] was seen today for establish care and abdominal pain.  Diagnoses and all orders for this visit:  Abdominal pain, epigastric -     pantoprazole (PROTONIX) 40 MG tablet; Take 1 tablet (40 mg total) by mouth daily. -     CBC with Differential/Platelet -     Comprehensive metabolic panel  PUD (peptic ulcer disease) -     pantoprazole (PROTONIX) 40 MG tablet; Take 1 tablet (40 mg total) by mouth daily. -     CBC with Differential/Platelet -     Comprehensive metabolic panel -     H. pylori breath test     Patient Instructions       IF you received an x-ray today, you will receive an invoice from [facility]. Please contact [facility] at [phone] with questions or concerns regarding your invoice.   IF you received labwork today, you will receive an invoice from [facility]. Please contact [facility] at [phone] with questions or concerns regarding your invoice.   Our billing staff will not be able to assist you with questions regarding bills from these companies.  You will be contacted with the lab results as soon as they are available. The fastest way to get your results is to activate your My Chart account. Instructions are located on the last page of this paperwork. If you have not heard from us regarding the results in 2 weeks, please contact this office.     Bland Diet A bland diet consists of foods that do not have a lot of fat or fiber. Foods without fat or fiber are easier for the body to digest. They are also less likely to irritate your mouth, throat, stomach, and  other parts of your gastrointestinal tract. A bland diet is sometimes called a BRAT diet. What is my plan? Your health care provider or dietitian may recommend specific changes to your diet to prevent and treat your symptoms, such as:  Eating small meals often.  Cooking food until it is soft enough to chew easily.  Chewing your food well.  Drinking fluids slowly.  Not eating foods that are very spicy, sour, or fatty.  Not eating citrus fruits, such as oranges and grapefruit.  What do I need to know about this diet?  Eat a variety of foods from the bland diet food list.  Do not follow a bland diet longer than you have to.  Ask your health care provider whether you should take vitamins. What foods can I eat? Grains  Hot cereals, such as cream  of wheat. Bread, crackers, or tortillas made from refined white flour. Rice. Vegetables Canned or cooked vegetables. Mashed or boiled potatoes. Fruits Bananas. Applesauce. Other types of cooked or canned fruit with the skin and seeds removed, such as canned peaches or pears. Meats and Other Protein Sources Scrambled eggs. Creamy peanut butter or other nut butters. Lean, well-cooked meats, such as chicken or fish. Tofu. Soups or broths. Dairy Low-fat dairy products, such as milk, cottage cheese, or yogurt. Beverages Water. Herbal tea. Apple juice. Sweets and Desserts Pudding. Custard. Fruit gelatin. Ice cream. Fats and Oils Mild salad dressings. Canola or olive oil. The items listed above may not be a complete list of allowed foods or beverages. Contact your dietitian for more options. What foods are not recommended? Foods and ingredients that are often not recommended include:  Spicy foods, such as hot sauce or salsa.  Fried foods.  Sour foods, such as pickled or fermented foods.  Raw vegetables or fruits, especially citrus or berries.  Caffeinated drinks.  Alcohol.  Strongly flavored seasonings or condiments.  The items  listed above may not be a complete list of foods and beverages that are not allowed. Contact your dietitian for more information. This information is not intended to replace advice given to you by your health care provider. Make sure you discuss any questions you have with your health care provider. Document Released: 06/18/2015 Document Revised: 08/02/2015 Document Reviewed: 03/08/2014 Elsevier Interactive Patient Education  2018 Elsevier Inc.     Edwina Barth, MD [facility]

## 2016-10-09 NOTE — Patient Instructions (Addendum)
     IF you received an x-ray today, you will receive an invoice from [facility]. Please contact [facility] at [phone] with questions or concerns regarding your invoice.   IF you received labwork today, you will receive an invoice from [facility]. Please contact [facility] at [phone] with questions or concerns regarding your invoice.   Our billing staff will not be able to assist you with questions regarding bills from these companies.  You will be contacted with the lab results as soon as they are available. The fastest way to get your results is to activate your My Chart account. Instructions are located on the last page of this paperwork. If you have not heard from us regarding the results in 2 weeks, please contact this office.      Bland Diet A bland diet consists of foods that do not have a lot of fat or fiber. Foods without fat or fiber are easier for the body to digest. They are also less likely to irritate your mouth, throat, stomach, and other parts of your gastrointestinal tract. A bland diet is sometimes called a BRAT diet. What is my plan? Your health care provider or dietitian may recommend specific changes to your diet to prevent and treat your symptoms, such as:  Eating small meals often.  Cooking food until it is soft enough to chew easily.  Chewing your food well.  Drinking fluids slowly.  Not eating foods that are very spicy, sour, or fatty.  Not eating citrus fruits, such as oranges and grapefruit.  What do I need to know about this diet?  Eat a variety of foods from the bland diet food list.  Do not follow a bland diet longer than you have to.  Ask your health care provider whether you should take vitamins. What foods can I eat? Grains  Hot cereals, such as cream of wheat. Bread, crackers, or tortillas made from refined white flour. Rice. Vegetables Canned or cooked vegetables. Mashed or boiled potatoes. Fruits Bananas.  Applesauce. Other types of cooked or canned fruit with the skin and seeds removed, such as canned peaches or pears. Meats and Other Protein Sources Scrambled eggs. Creamy peanut butter or other nut butters. Lean, well-cooked meats, such as chicken or fish. Tofu. Soups or broths. Dairy Low-fat dairy products, such as milk, cottage cheese, or yogurt. Beverages Water. Herbal tea. Apple juice. Sweets and Desserts Pudding. Custard. Fruit gelatin. Ice cream. Fats and Oils Mild salad dressings. Canola or olive oil. The items listed above may not be a complete list of allowed foods or beverages. Contact your dietitian for more options. What foods are not recommended? Foods and ingredients that are often not recommended include:  Spicy foods, such as hot sauce or salsa.  Fried foods.  Sour foods, such as pickled or fermented foods.  Raw vegetables or fruits, especially citrus or berries.  Caffeinated drinks.  Alcohol.  Strongly flavored seasonings or condiments.  The items listed above may not be a complete list of foods and beverages that are not allowed. Contact your dietitian for more information. This information is not intended to replace advice given to you by your health care provider. Make sure you discuss any questions you have with your health care provider. Document Released: 06/18/2015 Document Revised: 08/02/2015 Document Reviewed: 03/08/2014 Elsevier Interactive Patient Education  2018 Elsevier Inc.  

## 2016-10-10 ENCOUNTER — Encounter: Payer: Self-pay | Admitting: Emergency Medicine

## 2016-10-10 ENCOUNTER — Other Ambulatory Visit: Payer: Self-pay | Admitting: Emergency Medicine

## 2016-10-10 DIAGNOSIS — A048 Other specified bacterial intestinal infections: Secondary | ICD-10-CM

## 2016-10-10 LAB — CBC WITH DIFFERENTIAL/PLATELET
Basophils Absolute: 0 10*3/uL (ref 0.0–0.2)
Basos: 0 %
EOS (ABSOLUTE): 0.1 10*3/uL (ref 0.0–0.4)
EOS: 2 %
HEMATOCRIT: 42.5 % (ref 37.5–51.0)
HEMOGLOBIN: 14 g/dL (ref 13.0–17.7)
IMMATURE GRANULOCYTES: 0 %
Immature Grans (Abs): 0 10*3/uL (ref 0.0–0.1)
Lymphocytes Absolute: 1.7 10*3/uL (ref 0.7–3.1)
Lymphs: 46 %
MCH: 26.2 pg — ABNORMAL LOW (ref 26.6–33.0)
MCHC: 32.9 g/dL (ref 31.5–35.7)
MCV: 79 fL (ref 79–97)
MONOCYTES: 12 %
MONOS ABS: 0.5 10*3/uL (ref 0.1–0.9)
NEUTROS PCT: 40 %
Neutrophils Absolute: 1.5 10*3/uL (ref 1.4–7.0)
Platelets: 184 10*3/uL (ref 150–379)
RBC: 5.35 x10E6/uL (ref 4.14–5.80)
RDW: 13.3 % (ref 12.3–15.4)
WBC: 3.8 10*3/uL (ref 3.4–10.8)

## 2016-10-10 LAB — COMPREHENSIVE METABOLIC PANEL
ALT: 12 IU/L (ref 0–44)
AST: 19 IU/L (ref 0–40)
Albumin/Globulin Ratio: 1.5 (ref 1.2–2.2)
Albumin: 4.5 g/dL (ref 3.5–5.5)
Alkaline Phosphatase: 48 IU/L (ref 39–117)
BUN/Creatinine Ratio: 16 (ref 9–20)
BUN: 16 mg/dL (ref 6–20)
Bilirubin Total: 0.8 mg/dL (ref 0.0–1.2)
CALCIUM: 9.6 mg/dL (ref 8.7–10.2)
CO2: 23 mmol/L (ref 20–29)
CREATININE: 1 mg/dL (ref 0.76–1.27)
Chloride: 102 mmol/L (ref 96–106)
GFR calc Af Amer: 114 mL/min/{1.73_m2} (ref >59)
GFR, EST NON AFRICAN AMERICAN: 98 mL/min/{1.73_m2} (ref >59)
GLUCOSE: 62 mg/dL — AB (ref 65–99)
Globulin, Total: 3.1 g/dL (ref 1.5–4.5)
Potassium: 4.3 mmol/L (ref 3.5–5.2)
SODIUM: 142 mmol/L (ref 134–144)
Total Protein: 7.6 g/dL (ref 6.0–8.5)

## 2016-10-10 LAB — H. PYLORI BREATH TEST: H pylori Breath Test: POSITIVE — AB

## 2016-10-10 MED ORDER — CLARITHROMYCIN 500 MG PO TABS: 500.0000 mg | ORAL_TABLET | Freq: Two times a day (BID) | ORAL | 0 refills | Status: AC

## 2016-10-10 MED ORDER — AMOXICILLIN 500 MG PO CAPS: 1000.0000 mg | ORAL_CAPSULE | Freq: Two times a day (BID) | ORAL | 0 refills | Status: AC

## 2016-11-26 ENCOUNTER — Other Ambulatory Visit: Payer: Self-pay | Admitting: Emergency Medicine

## 2016-11-26 DIAGNOSIS — R1013 Epigastric pain: Secondary | ICD-10-CM

## 2016-11-26 DIAGNOSIS — K279 Peptic ulcer, site unspecified, unspecified as acute or chronic, without hemorrhage or perforation: Secondary | ICD-10-CM

## 2017-02-04 ENCOUNTER — Ambulatory Visit (INDEPENDENT_AMBULATORY_CARE_PROVIDER_SITE_OTHER): Payer: Managed Care, Other (non HMO) | Admitting: Emergency Medicine

## 2017-02-04 ENCOUNTER — Other Ambulatory Visit: Payer: Self-pay

## 2017-02-04 ENCOUNTER — Encounter: Payer: Self-pay | Admitting: Emergency Medicine

## 2017-02-04 VITALS — BP 104/58 | HR 89 | Temp 98.8°F | Resp 16 | Ht 67.0 in | Wt 129.0 lb

## 2017-02-04 DIAGNOSIS — M549 Dorsalgia, unspecified: Secondary | ICD-10-CM | POA: Diagnosis not present

## 2017-02-04 DIAGNOSIS — M7918 Myalgia, other site: Secondary | ICD-10-CM | POA: Diagnosis not present

## 2017-02-04 MED ORDER — CYCLOBENZAPRINE HCL 10 MG PO TABS: 10.0000 mg | ORAL_TABLET | Freq: Three times a day (TID) | ORAL | 0 refills | Status: DC | PRN

## 2017-02-04 NOTE — Patient Instructions (Addendum)
Cervical Sprain A cervical sprain is a stretch or tear in the tissues that connect bones (ligaments) in the neck. Most neck (cervical) sprains get better in 4-6 weeks. Follow these instructions at home: If you have a neck collar:  Wear it as told by your doctor. Do not take off (do not remove) the collar unless your doctor says that this is safe.  Ask your doctor before adjusting your collar.  If you have long hair, keep it outside of the collar.  Ask your doctor if you may take off the collar for cleaning and bathing. If you may take off the collar: ? Follow instructions from your doctor about how to take off the collar safely. ? Clean the collar by wiping it with mild soap and water. Let it air-dry all the way. ? If your collar has removable pads:  Take the pads out every 1-2 days.  Hand wash the pads with soap and water.  Let the pads air-dry all the way before you put them back in the collar. Do not dry them in a clothes dryer. Do not dry them with a hair dryer. ? Check your skin under the collar for irritation or sores. If you see any, tell your doctor. Managing pain, stiffness, and swelling  Use a cervical traction device, if told by your doctor.  If told, put heat on the affected area. Do this before exercises (physical therapy) or as often as told by your doctor. Use the heat source that your doctor recommends, such as a moist heat pack or a heating pad. ? Place a towel between your skin and the heat source. ? Leave the heat on for 20-30 minutes. ? Take the heat off (remove the heat) if your skin turns bright red. This is very important if you cannot feel pain, heat, or cold. You may have a greater risk of getting burned.  Put ice on the affected area. ? Put ice in a plastic bag. ? Place a towel between your skin and the bag. ? Leave the ice on for 20 minutes, 2-3 times a day. Activity  Do not drive while wearing a neck collar. If you do not have a neck collar, ask  your doctor if it is safe to drive.  Do not drive or use heavy machinery while taking prescription pain medicine or muscle relaxants, unless your doctor approves.  Do not lift anything that is heavier than 10 lb (4.5 kg) until your doctor tells you that it is safe.  Rest as told by your doctor.  Avoid activities that make you feel worse. Ask your doctor what activities are safe for you.  Do exercises as told by your doctor or physical therapist. Preventing neck sprain  Practice good posture. Adjust your workstation to help with this, if needed.  Exercise regularly as told by your doctor or physical therapist.  Avoid activities that are risky or may cause a neck sprain (cervical sprain). General instructions  Take over-the-counter and prescription medicines only as told by your doctor.  Do not use any products that contain nicotine or tobacco. This includes cigarettes and e-cigarettes. If you need help quitting, ask your doctor.  Keep all follow-up visits as told by your doctor. This is important. Contact a doctor if:  You have pain or other symptoms that get worse.  You have symptoms that do not get better after 2 weeks.  You have pain that does not get better with medicine.  You start to  have new, unexplained symptoms.  You have sores or irritated skin from wearing your neck collar. Get help right away if:  You have very bad pain.  You have any of the following in any part of your body: ? Loss of feeling (numbness). ? Tingling. ? Weakness.  You cannot move a part of your body (you have paralysis).  Your activity level does not improve. Summary  A cervical sprain is a stretch or tear in the tissues that connect bones (ligaments) in the neck.  If you have a neck (cervical) collar, do not take off the collar unless your doctor says that this is safe.  Put ice on affected areas as told by your doctor.  Put heat on affected areas as told by your doctor.  Good  posture and regular exercise can help prevent a neck sprain from happening again. This information is not intended to replace advice given to you by your health care provider. Make sure you discuss any questions you have with your health care provider. Document Released: 08/13/2007 Document Revised: 11/06/2015 Document Reviewed: 11/06/2015 Elsevier Interactive Patient Education  2017 Elsevier Inc.  Muscle Pain, Adult Muscle pain (myalgia) may be mild or severe. In most cases, the pain lasts only a short time and it goes away without treatment. It is normal to feel some muscle pain after starting a workout program. Muscles that have not been used often will be sore at first. Muscle pain may also be caused by many other things, including:  Overuse or muscle strain, especially if you are not in shape. This is the most common cause of muscle pain.  Injury.  Bruises.  Viruses, such as the flu.  Infectious diseases.  A chronic condition that causes muscle tenderness, fatigue, and headache (fibromyalgia).  A condition, such as lupus, in which the body's disease-fighting system attacks other organs in the body (autoimmune or rheumatologic diseases).  Certain drugs, including ACE inhibitors and statins.  To diagnose the cause of your muscle pain, your health care provider will do a physical exam and ask questions about the pain and when it began. If you have not had muscle pain for very long, your health care provider may want to wait before doing much testing. If your muscle pain has lasted a long time, your health care provider may want to run tests right away. In some cases, this may include tests to rule out certain conditions or illnesses. Treatment for muscle pain depends on the cause. Home care is often enough to relieve muscle pain. Your health care provider may also prescribe anti-inflammatory medicine. Follow these instructions at home: Activity  If overuse is causing your muscle  pain: ? Slow down your activities until the pain goes away. ? Do regular, gentle exercises if you are not usually active. ? Warm up before exercising. Stretch before and after exercising. This can help lower the risk of muscle pain.  Do not continue working out if the pain is very bad. Bad pain could mean that you have injured a muscle. Managing pain and discomfort   If directed, apply ice to the sore muscle: ? Put ice in a plastic bag. ? Place a towel between your skin and the bag. ? Leave the ice on for 20 minutes, 2-3 times a day.  You may also alternate between applying ice and applying heat as told by your health care provider. To apply heat, use the heat source that your health care provider recommends, such as a moist heat  pack or a heating pad. ? Place a towel between your skin and the heat source. ? Leave the heat on for 20-30 minutes. ? Remove the heat if your skin turns bright red. This is especially important if you are unable to feel pain, heat, or cold. You may have a greater risk of getting burned. Medicines  Take over-the-counter and prescription medicines only as told by your health care provider.  Do not drive or use heavy machinery while taking prescription pain medicine. Contact a health care provider if:  Your muscle pain gets worse and medicines do not help.  You have muscle pain that lasts longer than 3 days.  You have a rash or fever along with muscle pain.  You have muscle pain after a tick bite.  You have muscle pain while working out, even though you are in good physical condition.  You have redness, soreness, or swelling along with muscle pain.  You have muscle pain after starting a new medicine or changing the dose of a medicine. Get help right away if:  You have trouble breathing.  You have trouble swallowing.  You have muscle pain along with a stiff neck, fever, and vomiting.  You have severe muscle weakness or cannot move part of your  body. This information is not intended to replace advice given to you by your health care provider. Make sure you discuss any questions you have with your health care provider. Document Released: 01/16/2006 Document Revised: 09/14/2015 Document Reviewed: 07/17/2015 Elsevier Interactive Patient Education  2018 [employer]   IF you received an x-ray today, you will receive an invoice from [facility]. Please contact [facility] at [phone] with questions or concerns regarding your invoice.   IF you received labwork today, you will receive an invoice from [facility]. Please contact [facility] at [phone] with questions or concerns regarding your invoice.   Our billing staff will not be able to assist you with questions regarding bills from these companies.  You will be contacted with the lab results as soon as they are available. The fastest way to get your results is to activate your My Chart account. Instructions are located on the last page of this paperwork. If you have not heard from us regarding the results in 2 weeks, please contact this office.

## 2017-02-04 NOTE — Progress Notes (Signed)
Samuel Byrd 31 y.o.   Chief Complaint  Patient presents with  . Neck Pain    x 2 days per patient at the back of neck    HISTORY OF PRESENT ILLNESS: This is a 31 y.o. male complaining of upper back pain x 2 days; denies trauma but sleeps face down with head turned to either side. Denies neurological symptoms.  Neck Pain   This is a new problem. The current episode started in the past 7 days. The problem occurs constantly. The problem has been unchanged. The pain is associated with a sleep position. Pain location: low posterior neck-upper back. The quality of the pain is described as aching. The pain is at a severity of 5/10. The pain is moderate. Exacerbated by: flexing head. The pain is same all the time. Pertinent negatives include no chest pain, fever, headaches, numbness, paresis, syncope, tingling, visual change, weakness or weight loss. He has tried acetaminophen for the symptoms. The treatment provided mild relief.     Prior to Admission medications   Medication Sig Start Date End Date Taking? Authorizing Provider  ferrous sulfate 325 (65 FE) MG EC tablet Take 1 tablet (325 mg total) by mouth 2 (two) times daily with a meal. 07/26/15   McClung, Marzella SchleinAngela M, PA-C  naproxen (NAPROSYN) 500 MG tablet Take 1 tablet (500 mg total) by mouth 2 (two) times daily. Patient not taking: Reported on 10/09/2016 03/12/16   Cheri Fowlerose, Kayla, PA-C  pantoprazole (PROTONIX) 40 MG tablet Take 1 tablet (40 mg total) by mouth 2 (two) times daily. 11/27/16   Georgina QuintSagardia, Ileah Falkenstein Jose, MD    No Known Allergies  Patient Active Problem List   Diagnosis Date Noted  . PUD (peptic ulcer disease) 10/09/2016  . Abdominal pain, epigastric 10/09/2016  . Acute upper GI bleed 07/22/2015  . GI bleed 07/22/2015  . Acute blood loss anemia 07/22/2015  . Syncope and collapse 07/22/2015    Past Medical History:  Diagnosis Date  . Ulcer, stomach peptic     Past Surgical History:  Procedure Laterality Date  .  ESOPHAGOGASTRODUODENOSCOPY N/A 07/23/2015   Procedure: ESOPHAGOGASTRODUODENOSCOPY (EGD);  Surgeon: Sherrilyn RistHenry L Danis III, MD;  Location: [facility];  Service: Endoscopy;  Laterality: N/A;    Social History   Socioeconomic History  . Marital status: Married    Spouse name: Not on file  . Number of children: 0  . Years of education: Not on file  . Highest education level: Not on file  Social Needs  . Financial resource strain: Not on file  . Food insecurity - worry: Not on file  . Food insecurity - inability: Not on file  . Transportation needs - medical: Not on file  . Transportation needs - non-medical: Not on file  Occupational History  . Occupation: [employer]  Tobacco Use  . Smoking status: Light Tobacco Smoker  . Smokeless tobacco: Never Used  . Tobacco comment: occasionally  Substance and Sexual Activity  . Alcohol use: Yes    Alcohol/week: 0.0 oz    Comment: occasionally  . Drug use: No  . Sexual activity: Not on file  Other Topics Concern  . Not on file  Social History Narrative  . Not on file    Family History  Problem Relation Age of Onset  . Heart attack Mother   . Ulcers Brother   . Colon cancer Neg Hx      Review of Systems  Constitutional: Negative.  Negative for chills, fever and weight loss.  HENT: Negative.  Negative for congestion, nosebleeds and sore throat.   Eyes: Negative.   Respiratory: Negative for shortness of breath.   Cardiovascular: Negative for chest pain, palpitations and syncope.  Gastrointestinal: Negative for nausea and vomiting.  Genitourinary: Negative.   Musculoskeletal: Positive for back pain and neck pain. Negative for joint pain and myalgias.  Skin: Negative.  Negative for rash.  Neurological: Positive for sensory change. Negative for dizziness, tingling, focal weakness, weakness, numbness and headaches.  Endo/Heme/Allergies: Negative.   All other systems reviewed and are negative.  Vitals:   02/04/17 1343  BP:  (!) 104/58  Pulse: 89  Resp: 16  Temp: 98.8 F (37.1 C)  SpO2: 99%     Physical Exam  Constitutional: He is oriented to person, place, and time. He appears well-developed and well-nourished.  HENT:  Head: Normocephalic and atraumatic.  Nose: Nose normal.  Mouth/Throat: Oropharynx is clear and moist.  Eyes: Conjunctivae and EOM are normal. Pupils are equal, round, and reactive to light.  Neck: Normal range of motion. Neck supple. No JVD present.  Cardiovascular: Normal rate, regular rhythm, normal heart sounds and intact distal pulses.  Pulmonary/Chest: Effort normal and breath sounds normal.  Abdominal: Soft. Bowel sounds are normal. He exhibits no distension. There is no tenderness.  Musculoskeletal: Normal range of motion.  Lymphadenopathy:    He has no cervical adenopathy.  Neurological: He is alert and oriented to person, place, and time. No sensory deficit. He exhibits normal muscle tone.  Skin: Skin is warm and dry. Capillary refill takes less than 2 seconds. No rash noted.  Psychiatric: He has a normal mood and affect. His behavior is normal.  Vitals reviewed.    ASSESSMENT & PLAN: Samuel Byrd was seen today for neck pain.  Diagnoses and all orders for this visit:  Upper back pain  Musculoskeletal pain  Other orders -     cyclobenzaprine (FLEXERIL) 10 MG tablet; Take 1 tablet (10 mg total) by mouth 3 (three) times daily as needed for muscle spasms.    Patient Instructions      Cervical Sprain A cervical sprain is a stretch or tear in the tissues that connect bones (ligaments) in the neck. Most neck (cervical) sprains get better in 4-6 weeks. Follow these instructions at home: If you have a neck collar:  Wear it as told by your doctor. Do not take off (do not remove) the collar unless your doctor says that this is safe.  Ask your doctor before adjusting your collar.  If you have long hair, keep it outside of the collar.  Ask your doctor if you may take  off the collar for cleaning and bathing. If you may take off the collar: ? Follow instructions from your doctor about how to take off the collar safely. ? Clean the collar by wiping it with mild soap and water. Let it air-dry all the way. ? If your collar has removable pads:  Take the pads out every 1-2 days.  Hand wash the pads with soap and water.  Let the pads air-dry all the way before you put them back in the collar. Do not dry them in a clothes dryer. Do not dry them with a hair dryer. ? Check your skin under the collar for irritation or sores. If you see any, tell your doctor. Managing pain, stiffness, and swelling  Use a cervical traction device, if told by your doctor.  If told, put heat on the affected area. Do this before exercises (  physical therapy) or as often as told by your doctor. Use the heat source that your doctor recommends, such as a moist heat pack or a heating pad. ? Place a towel between your skin and the heat source. ? Leave the heat on for 20-30 minutes. ? Take the heat off (remove the heat) if your skin turns bright red. This is very important if you cannot feel pain, heat, or cold. You may have a greater risk of getting burned.  Put ice on the affected area. ? Put ice in a plastic bag. ? Place a towel between your skin and the bag. ? Leave the ice on for 20 minutes, 2-3 times a day. Activity  Do not drive while wearing a neck collar. If you do not have a neck collar, ask your doctor if it is safe to drive.  Do not drive or use heavy machinery while taking prescription pain medicine or muscle relaxants, unless your doctor approves.  Do not lift anything that is heavier than 10 lb (4.5 kg) until your doctor tells you that it is safe.  Rest as told by your doctor.  Avoid activities that make you feel worse. Ask your doctor what activities are safe for you.  Do exercises as told by your doctor or physical therapist. Preventing neck sprain  Practice good  posture. Adjust your workstation to help with this, if needed.  Exercise regularly as told by your doctor or physical therapist.  Avoid activities that are risky or may cause a neck sprain (cervical sprain). General instructions  Take over-the-counter and prescription medicines only as told by your doctor.  Do not use any products that contain nicotine or tobacco. This includes cigarettes and e-cigarettes. If you need help quitting, ask your doctor.  Keep all follow-up visits as told by your doctor. This is important. Contact a doctor if:  You have pain or other symptoms that get worse.  You have symptoms that do not get better after 2 weeks.  You have pain that does not get better with medicine.  You start to have new, unexplained symptoms.  You have sores or irritated skin from wearing your neck collar. Get help right away if:  You have very bad pain.  You have any of the following in any part of your body: ? Loss of feeling (numbness). ? Tingling. ? Weakness.  You cannot move a part of your body (you have paralysis).  Your activity level does not improve. Summary  A cervical sprain is a stretch or tear in the tissues that connect bones (ligaments) in the neck.  If you have a neck (cervical) collar, do not take off the collar unless your doctor says that this is safe.  Put ice on affected areas as told by your doctor.  Put heat on affected areas as told by your doctor.  Good posture and regular exercise can help prevent a neck sprain from happening again. This information is not intended to replace advice given to you by your health care provider. Make sure you discuss any questions you have with your health care provider. Document Released: 08/13/2007 Document Revised: 11/06/2015 Document Reviewed: 11/06/2015 Elsevier Interactive Patient Education  2017 [employer].  Muscle Pain, Adult Muscle pain (myalgia) may be mild or severe. In most cases, the pain lasts  only a short time and it goes away without treatment. It is normal to feel some muscle pain after starting a workout program. Muscles that have not been used often will be  sore at first. Muscle pain may also be caused by many other things, including:  Overuse or muscle strain, especially if you are not in shape. This is the most common cause of muscle pain.  Injury.  Bruises.  Viruses, such as the flu.  Infectious diseases.  A chronic condition that causes muscle tenderness, fatigue, and headache (fibromyalgia).  A condition, such as lupus, in which the body's disease-fighting system attacks other organs in the body (autoimmune or rheumatologic diseases).  Certain drugs, including ACE inhibitors and statins.  To diagnose the cause of your muscle pain, your health care provider will do a physical exam and ask questions about the pain and when it began. If you have not had muscle pain for very long, your health care provider may want to wait before doing much testing. If your muscle pain has lasted a long time, your health care provider may want to run tests right away. In some cases, this may include tests to rule out certain conditions or illnesses. Treatment for muscle pain depends on the cause. Home care is often enough to relieve muscle pain. Your health care provider may also prescribe anti-inflammatory medicine. Follow these instructions at home: Activity  If overuse is causing your muscle pain: ? Slow down your activities until the pain goes away. ? Do regular, gentle exercises if you are not usually active. ? Warm up before exercising. Stretch before and after exercising. This can help lower the risk of muscle pain.  Do not continue working out if the pain is very bad. Bad pain could mean that you have injured a muscle. Managing pain and discomfort   If directed, apply ice to the sore muscle: ? Put ice in a plastic bag. ? Place a towel between your skin and the bag. ? Leave  the ice on for 20 minutes, 2-3 times a day.  You may also alternate between applying ice and applying heat as told by your health care provider. To apply heat, use the heat source that your health care provider recommends, such as a moist heat pack or a heating pad. ? Place a towel between your skin and the heat source. ? Leave the heat on for 20-30 minutes. ? Remove the heat if your skin turns bright red. This is especially important if you are unable to feel pain, heat, or cold. You may have a greater risk of getting burned. Medicines  Take over-the-counter and prescription medicines only as told by your health care provider.  Do not drive or use heavy machinery while taking prescription pain medicine. Contact a health care provider if:  Your muscle pain gets worse and medicines do not help.  You have muscle pain that lasts longer than 3 days.  You have a rash or fever along with muscle pain.  You have muscle pain after a tick bite.  You have muscle pain while working out, even though you are in good physical condition.  You have redness, soreness, or swelling along with muscle pain.  You have muscle pain after starting a new medicine or changing the dose of a medicine. Get help right away if:  You have trouble breathing.  You have trouble swallowing.  You have muscle pain along with a stiff neck, fever, and vomiting.  You have severe muscle weakness or cannot move part of your body. This information is not intended to replace advice given to you by your health care provider. Make sure you discuss any questions you have with your  health care provider. Document Released: 01/16/2006 Document Revised: 09/14/2015 Document Reviewed: 07/17/2015 Elsevier Interactive Patient Education  2018 [employer].   IF you received an x-ray today, you will receive an invoice from [facility]. Please contact [facility] at [phone] with questions or concerns regarding  your invoice.   IF you received labwork today, you will receive an invoice from [facility]. Please contact [facility] at [phone] with questions or concerns regarding your invoice.   Our billing staff will not be able to assist you with questions regarding bills from these companies.  You will be contacted with the lab results as soon as they are available. The fastest way to get your results is to activate your My Chart account. Instructions are located on the last page of this paperwork. If you have not heard from [place] regarding the results in 2 weeks, please contact this office.         Edwina Barth, MD [facility]

## 2017-04-21 ENCOUNTER — Encounter: Payer: Self-pay | Admitting: Emergency Medicine

## 2017-04-21 ENCOUNTER — Ambulatory Visit (INDEPENDENT_AMBULATORY_CARE_PROVIDER_SITE_OTHER): Payer: Managed Care, Other (non HMO)

## 2017-04-21 ENCOUNTER — Telehealth: Payer: Self-pay | Admitting: Emergency Medicine

## 2017-04-21 ENCOUNTER — Ambulatory Visit (INDEPENDENT_AMBULATORY_CARE_PROVIDER_SITE_OTHER): Payer: Managed Care, Other (non HMO) | Admitting: Emergency Medicine

## 2017-04-21 VITALS — BP 112/70 | HR 72 | Temp 99.0°F | Resp 16 | Ht 67.0 in | Wt 124.0 lb

## 2017-04-21 DIAGNOSIS — N6310 Unspecified lump in the right breast, unspecified quadrant: Secondary | ICD-10-CM | POA: Diagnosis not present

## 2017-04-21 DIAGNOSIS — R0600 Dyspnea, unspecified: Secondary | ICD-10-CM | POA: Diagnosis not present

## 2017-04-21 LAB — POCT CBC
Granulocyte percent: 37.7 %G (ref 37–80)
HCT, POC: 44.5 % (ref 43.5–53.7)
Hemoglobin: 14.3 g/dL (ref 14.1–18.1)
Lymph, poc: 1.6 (ref 0.6–3.4)
MCH, POC: 26 pg — AB (ref 27–31.2)
MCHC: 32.2 g/dL (ref 31.8–35.4)
MCV: 80.8 fL (ref 80–97)
MID (cbc): 0.3 (ref 0–0.9)
MPV: 8.3 fL (ref 0–99.8)
POC Granulocyte: 1.1 — AB (ref 2–6.9)
POC LYMPH PERCENT: 54.2 %L — AB (ref 10–50)
POC MID %: 8.5 %M (ref 0–12)
Platelet Count, POC: 179 10*3/uL (ref 142–424)
RBC: 5.5 M/uL (ref 4.69–6.13)
RDW, POC: 12.6 %
WBC: 3 10*3/uL — AB (ref 4.6–10.2)

## 2017-04-21 MED ORDER — CEPHALEXIN 500 MG PO CAPS: 500.0000 mg | ORAL_CAPSULE | Freq: Three times a day (TID) | ORAL | 0 refills | Status: AC

## 2017-04-21 NOTE — Patient Instructions (Addendum)
     IF you received an x-ray today, you will receive an invoice from [facility]. Please contact [facility] at [phone] with questions or concerns regarding your invoice.   IF you received labwork today, you will receive an invoice from [facility]. Please contact [facility] at [phone] with questions or concerns regarding your invoice.   Our billing staff will not be able to assist you with questions regarding bills from these companies.  You will be contacted with the lab results as soon as they are available. The fastest way to get your results is to activate your My Chart account. Instructions are located on the last page of this paperwork. If you have not heard from us regarding the results in 2 weeks, please contact this office.     Shortness of Breath, Adult Shortness of breath means you have trouble breathing. Your lungs are organs for breathing. Follow these instructions at home: Pay attention to any changes in your symptoms. Take these actions to help with your condition:  Do not smoke. Smoking can cause shortness of breath. If you need help to quit smoking, ask your doctor.  Avoid things that can make it harder to breathe, such as: ? Mold. ? Dust. ? Air pollution. ? Chemical smells. ? Things that can cause allergy symptoms (allergens), if you have allergies.  Keep your living space clean and free of mold and dust.  Rest as needed. Slowly return to your usual activities.  Take over-the-counter and prescription medicines, including oxygen and inhaled medicines, only as told by your doctor.  Keep all follow-up visits as told by your doctor. This is important.  Contact a doctor if:  Your condition does not get better as soon as expected.  You have a hard time doing your normal activities, even after you rest.  You have new symptoms. Get help right away if:  You have trouble breathing when you are resting.  You feel light-headed or you  faint.  You have a cough that is not helped by medicines.  You cough up blood.  You have pain with breathing.  You have pain in your chest, arms, shoulders, or belly (abdomen).  You have a fever.  You cannot walk up stairs.  You cannot exercise the way you normally do. This information is not intended to replace advice given to you by your health care provider. Make sure you discuss any questions you have with your health care provider. Document Released: 08/13/2007 Document Revised: 03/13/2016 Document Reviewed: 03/13/2016 Elsevier Interactive Patient Education  2017 Elsevier Inc.  

## 2017-04-21 NOTE — Telephone Encounter (Signed)
Patient will need STAT US scheduled for 04/22/17, please call to schedule.

## 2017-04-21 NOTE — Progress Notes (Signed)
Samuel Byrd 32 y.o.   Chief Complaint  Patient presents with  . lump under breast    pt notice a lump on the rt side of chest, x 1 wk  . Shortness of Breath    x1 month    HISTORY OF PRESENT ILLNESS: This is a 32 y.o. male complaining of shortness of breath for 1 month and lump to the right breast for 1 week.  HPI   Prior to Admission medications   Medication Sig Start Date End Date Taking? Authorizing Provider  cyclobenzaprine (FLEXERIL) 10 MG tablet Take 1 tablet (10 mg total) by mouth 3 (three) times daily as needed for muscle spasms. 02/04/17  Yes Georgina Quint, MD  ferrous sulfate 325 (65 FE) MG EC tablet Take 1 tablet (325 mg total) by mouth 2 (two) times daily with a meal. 07/26/15  Yes McClung, Angela M, PA-C  pantoprazole (PROTONIX) 40 MG tablet Take 1 tablet (40 mg total) by mouth 2 (two) times daily. 11/27/16  Yes Nashiya Disbrow, Eilleen Kempf, MD  naproxen (NAPROSYN) 500 MG tablet Take 1 tablet (500 mg total) by mouth 2 (two) times daily. Patient not taking: Reported on 10/09/2016 03/12/16   Cheri Fowler, PA-C    No Known Allergies  Patient Active Problem List   Diagnosis Date Noted  . Upper back pain 02/04/2017  . Musculoskeletal pain 02/04/2017  . PUD (peptic ulcer disease) 10/09/2016  . Abdominal pain, epigastric 10/09/2016  . Acute upper GI bleed 07/22/2015  . GI bleed 07/22/2015  . Acute blood loss anemia 07/22/2015  . Syncope and collapse 07/22/2015    Past Medical History:  Diagnosis Date  . Ulcer, stomach peptic     Past Surgical History:  Procedure Laterality Date  . ESOPHAGOGASTRODUODENOSCOPY N/A 07/23/2015   Procedure: ESOPHAGOGASTRODUODENOSCOPY (EGD);  Surgeon: Sherrilyn Rist, MD;  Location: [facility];  Service: Endoscopy;  Laterality: N/A;    Social History   Socioeconomic History  . Marital status: Married    Spouse name: Not on file  . Number of children: 0  . Years of education: Not on file  . Highest education level: Not on file    Social Needs  . Financial resource strain: Not on file  . Food insecurity - worry: Not on file  . Food insecurity - inability: Not on file  . Transportation needs - medical: Not on file  . Transportation needs - non-medical: Not on file  Occupational History  . Occupation: [employer]  Tobacco Use  . Smoking status: Light Tobacco Smoker  . Smokeless tobacco: Never Used  . Tobacco comment: occasionally  Substance and Sexual Activity  . Alcohol use: Yes    Alcohol/week: 0.0 oz    Comment: occasionally  . Drug use: No  . Sexual activity: Not on file  Other Topics Concern  . Not on file  Social History Narrative  . Not on file    Family History  Problem Relation Age of Onset  . Heart attack Mother   . Ulcers Brother   . Colon cancer Neg Hx      Review of Systems  Constitutional: Positive for weight loss (8 lbs in 1 month). Negative for chills, fever and malaise/fatigue.  HENT: Negative.   Eyes: Negative.  Negative for discharge and redness.  Respiratory: Positive for shortness of breath. Negative for cough, hemoptysis and wheezing.   Cardiovascular: Negative.  Negative for chest pain, palpitations, claudication and leg swelling.  Gastrointestinal: Negative for abdominal pain, nausea and vomiting.  Genitourinary: Positive for dysuria. Negative for hematuria.  Musculoskeletal: Negative.  Negative for back pain, myalgias and neck pain.  Skin: Negative.  Negative for rash.  Neurological: Negative.  Negative for dizziness and headaches.  Endo/Heme/Allergies: Negative.   All other systems reviewed and are negative.   Vitals:   04/21/17 1655  BP: 112/70  Pulse: 72  Resp: 16  Temp: 99 F (37.2 C)  SpO2: 100%   Results for orders placed or performed in visit on 04/21/17 (from the past 24 hour(s))  POCT CBC     Status: Abnormal   Collection Time: 04/21/17  5:30 PM  Result Value Ref Range   WBC 3.0 (A) 4.6 - 10.2 K/uL   Lymph, poc 1.6 0.6 - 3.4   POC LYMPH  PERCENT 54.2 (A) 10 - 50 %L   MID (cbc) 0.3 0 - 0.9   POC MID % 8.5 0 - 12 %M   POC Granulocyte 1.1 (A) 2 - 6.9   Granulocyte percent 37.7 37 - 80 %G   RBC 5.50 4.69 - 6.13 M/uL   Hemoglobin 14.3 14.1 - 18.1 g/dL   HCT, POC 40.944.5 81.143.5 - 53.7 %   MCV 80.8 80 - 97 fL   MCH, POC 26.0 (A) 27 - 31.2 pg   MCHC 32.2 31.8 - 35.4 g/dL   RDW, POC 91.412.6 %   Platelet Count, POC 179 142 - 424 K/uL   MPV 8.3 0 - 99.8 fL   Vitals:   04/21/17 1655  BP: 112/70  Pulse: 72  Resp: 16  Temp: 99 F (37.2 C)  SpO2: 100%    Physical Exam  Constitutional: He is oriented to person, place, and time. He appears well-developed and well-nourished.  HENT:  Head: Normocephalic and atraumatic.  Nose: Nose normal.  Mouth/Throat: Oropharynx is clear and moist.  Eyes: Conjunctivae and EOM are normal. Pupils are equal, round, and reactive to light.  Neck: Normal range of motion. Neck supple.  Cardiovascular: Normal rate, regular rhythm and normal heart sounds.  Pulmonary/Chest: Effort normal and breath sounds normal. Right breast exhibits mass and tenderness. Right breast exhibits no nipple discharge and no skin change.    Abdominal: Soft. Bowel sounds are normal. There is no tenderness.  Musculoskeletal: Normal range of motion.  Lymphadenopathy:    He has no cervical adenopathy.  Neurological: He is alert and oriented to person, place, and time. No sensory deficit. He exhibits normal muscle tone.  Skin: Skin is warm and dry. Capillary refill takes less than 2 seconds. No rash noted.  Psychiatric: He has a normal mood and affect. His behavior is normal.  Vitals reviewed.  A total of 25 minutes was spent in the room with the patient, greater than 50% of which was in counseling/coordination of care.   ASSESSMENT & PLAN: Samuel Byrd was seen today for lump under breast and shortness of breath.  Diagnoses and all orders for this visit:  Dyspnea, unspecified type -     Comprehensive metabolic panel -      POCT CBC -     DG Chest 2 View; Future  Breast mass, right -     US BREAST COMPLETE UNI RIGHT INC AXILLA; Future -     cephALEXin (KEFLEX) 500 MG capsule; Take 1 capsule (500 mg total) by mouth 3 (three) times daily for 7 days.    Patient Instructions       IF you received an x-ray today, you will receive an invoice from [facility]. Please  contact [facility] at [phone] with questions or concerns regarding your invoice.   IF you received labwork today, you will receive an invoice from [place]. Please contact [facility] at [phone] with questions or concerns regarding your invoice.   Our billing staff will not be able to assist you with questions regarding bills from these companies.  You will be contacted with the lab results as soon as they are available. The fastest way to get your results is to activate your My Chart account. Instructions are located on the last page of this paperwork. If you have not heard from [place] regarding the results in 2 weeks, please contact this office.      Shortness of Breath, Adult Shortness of breath means you have trouble breathing. Your lungs are organs for breathing. Follow these instructions at home: Pay attention to any changes in your symptoms. Take these actions to help with your condition:  Do not smoke. Smoking can cause shortness of breath. If you need help to quit smoking, ask your doctor.  Avoid things that can make it harder to breathe, such as: ? Mold. ? Dust. ? Air pollution. ? Chemical smells. ? Things that can cause allergy symptoms (allergens), if you have allergies.  Keep your living space clean and free of mold and dust.  Rest as needed. Slowly return to your usual activities.  Take over-the-counter and prescription medicines, including oxygen and inhaled medicines, only as told by your doctor.  Keep all follow-up visits as told by your doctor. This is important.  Contact a doctor if:  Your  condition does not get better as soon as expected.  You have a hard time doing your normal activities, even after you rest.  You have new symptoms. Get help right away if:  You have trouble breathing when you are resting.  You feel light-headed or you faint.  You have a cough that is not helped by medicines.  You cough up blood.  You have pain with breathing.  You have pain in your chest, arms, shoulders, or belly (abdomen).  You have a fever.  You cannot walk up stairs.  You cannot exercise the way you normally do. This information is not intended to replace advice given to you by your health care provider. Make sure you discuss any questions you have with your health care provider. Document Released: 08/13/2007 Document Revised: 03/13/2016 Document Reviewed: 03/13/2016 Elsevier Interactive Patient Education  2017 Elsevier Inc.      Edwina Barth, MD [facility]

## 2017-04-22 ENCOUNTER — Other Ambulatory Visit: Payer: Self-pay | Admitting: *Deleted

## 2017-04-22 ENCOUNTER — Ambulatory Visit
Admission: RE | Admit: 2017-04-22 | Discharge: 2017-04-22 | Disposition: A | Discharge status: Home / Self Care | Payer: Managed Care, Other (non HMO) | Source: Ambulatory Visit | Attending: Emergency Medicine | Admitting: Emergency Medicine

## 2017-04-22 ENCOUNTER — Ambulatory Visit
Admission: RE | Admit: 2017-04-22 | Discharge: 2017-04-22 | Disposition: A | Discharge status: Home / Self Care | Payer: Federal, State, Local not specified - PPO | Source: Ambulatory Visit | Attending: Emergency Medicine | Admitting: Emergency Medicine

## 2017-04-22 ENCOUNTER — Encounter: Payer: Self-pay | Admitting: Emergency Medicine

## 2017-04-22 ENCOUNTER — Other Ambulatory Visit: Payer: Self-pay | Admitting: Emergency Medicine

## 2017-04-22 ENCOUNTER — Telehealth: Payer: Self-pay | Admitting: *Deleted

## 2017-04-22 DIAGNOSIS — N6310 Unspecified lump in the right breast, unspecified quadrant: Secondary | ICD-10-CM

## 2017-04-22 LAB — COMPREHENSIVE METABOLIC PANEL
A/G RATIO: 1.4 (ref 1.2–2.2)
ALBUMIN: 4.6 g/dL (ref 3.5–5.5)
ALT: 16 IU/L (ref 0–44)
AST: 18 IU/L (ref 0–40)
Alkaline Phosphatase: 50 IU/L (ref 39–117)
BUN / CREAT RATIO: 8 — AB (ref 9–20)
BUN: 8 mg/dL (ref 6–20)
Bilirubin Total: 0.7 mg/dL (ref 0.0–1.2)
CALCIUM: 9.5 mg/dL (ref 8.7–10.2)
CO2: 23 mmol/L (ref 20–29)
CREATININE: 1 mg/dL (ref 0.76–1.27)
Chloride: 101 mmol/L (ref 96–106)
GFR calc non Af Amer: 98 mL/min/{1.73_m2} (ref >59)
GFR, EST AFRICAN AMERICAN: 114 mL/min/{1.73_m2} (ref >59)
GLOBULIN, TOTAL: 3.4 g/dL (ref 1.5–4.5)
Glucose: 80 mg/dL (ref 65–99)
Potassium: 4 mmol/L (ref 3.5–5.2)
SODIUM: 140 mmol/L (ref 134–144)
TOTAL PROTEIN: 8 g/dL (ref 6.0–8.5)

## 2017-04-22 NOTE — Telephone Encounter (Signed)
West BaliPaul Kyzer needs ultrasound and mammo signed

## 2017-04-22 NOTE — Addendum Note (Signed)
Addended by: Evie LacksSAGARDIA, Hisayo Delossantos J on: 04/22/2017 09:38 AM   Modules accepted: Orders

## 2017-04-22 NOTE — Telephone Encounter (Signed)
Signed new/corrected order for the breast US. I see the mammogram, but do not see the open order to sign. Please clarify with the imaging facility, and I will order it again if needed.

## 2017-04-22 NOTE — Telephone Encounter (Signed)
Lm message patient is scheduled for an appointment at 1;10 [facility] [address]  [address].

## 2017-04-28 ENCOUNTER — Other Ambulatory Visit: Payer: Self-pay

## 2017-04-28 ENCOUNTER — Encounter: Payer: Self-pay | Admitting: Emergency Medicine

## 2017-04-28 ENCOUNTER — Ambulatory Visit (INDEPENDENT_AMBULATORY_CARE_PROVIDER_SITE_OTHER): Payer: Managed Care, Other (non HMO) | Admitting: Emergency Medicine

## 2017-04-28 VITALS — BP 100/60 | HR 70 | Temp 98.6°F | Resp 16 | Ht 67.0 in | Wt 125.4 lb

## 2017-04-28 DIAGNOSIS — R1013 Epigastric pain: Secondary | ICD-10-CM

## 2017-04-28 DIAGNOSIS — K279 Peptic ulcer, site unspecified, unspecified as acute or chronic, without hemorrhage or perforation: Secondary | ICD-10-CM | POA: Diagnosis not present

## 2017-04-28 DIAGNOSIS — N62 Hypertrophy of breast: Secondary | ICD-10-CM

## 2017-04-28 MED ORDER — PANTOPRAZOLE SODIUM 40 MG PO TBEC: 40.0000 mg | DELAYED_RELEASE_TABLET | Freq: Every day | ORAL | 3 refills | Status: DC

## 2017-04-28 NOTE — Progress Notes (Signed)
Samuel Byrd 32 y.o.   Chief Complaint  Patient presents with  . mammogram    follow up - mass of RIGHT breast area  . Medication Refill    PROTONIX    HISTORY OF PRESENT ILLNESS: This is a 32 y.o. male here for follow-up of right-sided gynecomastia.  Had ultrasound and mammogram done.  Results were negative for malignancy.  Patient taking antibiotics.  Feels a little better.  No new symptoms.  Not taking any type of hormone altering medication.  No clear-cut reason for his gynecomastia.  Seen by me one week ago.  Denies any difficulty breathing at this time.  Requesting a prescription refill for Protonix which he takes for occasional epigastric burning sensation.  HPI   Prior to Admission medications   Medication Sig Start Date End Date Taking? Authorizing Provider  cephALEXin (KEFLEX) 500 MG capsule Take 1 capsule (500 mg total) by mouth 3 (three) times daily for 7 days. 04/21/17 04/28/17 Yes Samuel Byrd, Samuel Kempf, MD  naproxen (NAPROSYN) 500 MG tablet Take 1 tablet (500 mg total) by mouth 2 (two) times daily. 03/12/16  Yes Samuel Fowler, PA-C  pantoprazole (PROTONIX) 40 MG tablet Take 1 tablet (40 mg total) by mouth 2 (two) times daily. 11/27/16  Yes Samuel Byrd, Samuel Kempf, MD  cyclobenzaprine (FLEXERIL) 10 MG tablet Take 1 tablet (10 mg total) by mouth 3 (three) times daily as needed for muscle spasms. Patient not taking: Reported on 04/28/2017 02/04/17   Samuel Quint, MD  ferrous sulfate 325 (65 FE) MG EC tablet Take 1 tablet (325 mg total) by mouth 2 (two) times daily with a meal. Patient not taking: Reported on 04/28/2017 07/26/15   Samuel Simmonds, PA-C    No Known Allergies  Patient Active Problem List   Diagnosis Date Noted  . Dyspnea 04/21/2017  . Breast mass, right 04/21/2017  . Upper back pain 02/04/2017  . Musculoskeletal pain 02/04/2017  . PUD (peptic ulcer disease) 10/09/2016  . Abdominal pain, epigastric 10/09/2016  . Acute upper GI bleed 07/22/2015  . GI  bleed 07/22/2015  . Acute blood loss anemia 07/22/2015  . Syncope and collapse 07/22/2015    Past Medical History:  Diagnosis Date  . Ulcer, stomach peptic     Past Surgical History:  Procedure Laterality Date  . ESOPHAGOGASTRODUODENOSCOPY N/A 07/23/2015   Procedure: ESOPHAGOGASTRODUODENOSCOPY (EGD);  Surgeon: Sherrilyn Rist, MD;  Location: [facility];  Service: Endoscopy;  Laterality: N/A;    Social History   Socioeconomic History  . Marital status: Married    Spouse name: Not on file  . Number of children: 0  . Years of education: Not on file  . Highest education level: Not on file  Social Needs  . Financial resource strain: Not on file  . Food insecurity - worry: Not on file  . Food insecurity - inability: Not on file  . Transportation needs - medical: Not on file  . Transportation needs - non-medical: Not on file  Occupational History  . Occupation: [employer]  Tobacco Use  . Smoking status: Light Tobacco Smoker  . Smokeless tobacco: Never Used  . Tobacco comment: occasionally  Substance and Sexual Activity  . Alcohol use: Yes    Alcohol/week: 0.0 oz    Comment: occasionally  . Drug use: No  . Sexual activity: Not on file  Other Topics Concern  . Not on file  Social History Narrative  . Not on file    Family History  Problem Relation  Age of Onset  . Heart attack Mother   . Ulcers Brother   . Colon cancer Neg Hx      Review of Systems  Constitutional: Negative.  Negative for chills and fever.  Respiratory: Negative.  Negative for cough and shortness of breath.   Cardiovascular: Negative.  Negative for chest pain and palpitations.  Gastrointestinal: Negative.  Negative for abdominal pain, diarrhea, nausea and vomiting.  Skin: Negative.  Negative for rash.  Neurological: Negative for dizziness and headaches.  Endo/Heme/Allergies: Negative.   All other systems reviewed and are negative.   Vitals:   04/28/17 1457  BP: 100/60  Pulse: 70   Resp: 16  Temp: 98.6 F (37 C)  SpO2: 100%    Physical Exam  Constitutional: He is oriented to person, place, and time. He appears well-developed and well-nourished.  HENT:  Head: Normocephalic and atraumatic.  Eyes: Pupils are equal, round, and reactive to light.  Neck: Normal range of motion.  Cardiovascular: Normal rate, regular rhythm and normal heart sounds.  Pulmonary/Chest: Effort normal and breath sounds normal.  Musculoskeletal: Normal range of motion.  Neurological: He is alert and oriented to person, place, and time.  Skin: Capillary refill takes less than 2 seconds.  Right-sided gynecomastia  Psychiatric: He has a normal mood and affect. His behavior is normal.  Vitals reviewed.      ASSESSMENT & PLAN: [name] was seen today for mammogram and medication refill.  Diagnoses and all orders for this visit:  Gynecomastia Comments: unilateral Orders: -     Ambulatory referral to General Surgery  PUD (peptic ulcer disease) -     pantoprazole (PROTONIX) 40 MG tablet; Take 1 tablet (40 mg total) by mouth daily.  Abdominal pain, epigastric -     pantoprazole (PROTONIX) 40 MG tablet; Take 1 tablet (40 mg total) by mouth daily.  A total of 25 minutes was spent in the room with the patient, greater than 50% of which was in counseling/coordination of care.   Patient Instructions       IF you received an x-ray today, you will receive an invoice from [facility]. Please contact [facility] at [phone] with questions or concerns regarding your invoice.   IF you received labwork today, you will receive an invoice from [place]. Please contact [facility] at [phone] with questions or concerns regarding your invoice.   Our billing staff will not be able to assist you with questions regarding bills from these companies.  You will be contacted with the lab results as soon as they are available. The fastest way to get your results is to activate  your My Chart account. Instructions are located on the last page of this paperwork. If you have not heard from [place] regarding the results in 2 weeks, please contact this office.     Gynecomastia, Adult Gynecomastia is an overgrowth of gland tissue in a man's breasts. This may cause one or both breasts to become enlarged. This often develops in men who have an imbalance of the male sex hormone (testosterone) and the male sex hormone (estrogen). This means that a man may have too much estrogen, too little testosterone, or both. Gynecomastia may be a normal part of aging for some men. It can also happen to adolescent boys during puberty. What are the causes? Gynecomastia may be caused by:  Certain medicines, such as: ? Estrogen supplements and medicines that act like estrogen in the body. ? Medicines that keep testosterone from functioning normally in the body (  testosterone-inhibiting drugs). ? Anabolic steroids. ? Medicines to treat heartburn, cancer, heart disease, mental health problems, HIV (human immunodeficiency virus) or AIDS (acquired immunodeficiency syndrome). ? Antibiotic medicine. ? Chemotherapy medicine.  Recreational drugs, including alcohol, marijuana, and opioids.  Herbal products, including lavender and tea tree oil.  A gene that is passed along from parent to child (inherited).  Tumors in the pituitary or adrenal gland.  An overactive thyroid gland.  Certain inherited disorders, including a genetic disease that causes low testosterone in males (Klinefelter syndrome).  Cancer of the lung, kidney, liver, testicle, or gastrointestinal tract.  Conditions that cause liver or kidney failure.  Poor nutrition and starvation.  Testicle shrinking or failure (testicularatrophy).  In some cases, the cause may not be known. What increases the risk? You may have a higher risk for gynecomastia if you:  Are 34 years old or older.  Are overweight.  Abuse alcohol or other  drugs.  Have a family history of gynecomastia.  What are the signs or symptoms?  Most of the time, breast enlargement is the only symptom. The enlargement may start near the nipple, and the breast tissue may feel firm and rubbery. The breast may feel itchy, painful or tender. How is this diagnosed? This condition may be diagnosed based on:  Your symptoms.  Your medical history.  A physical exam.  Imaging tests, such as: ? An ultrasound. ? A mammogram. ? An MRI.  Blood tests.  Removal of a sample of breast tissue to be tested in a lab (biopsy).  How is this treated? Gynecomastia may go away on its own, without treatment. If gynecomastia is caused by a medical problem or drug abuse, treatment may include:  Getting treatment for the underlying medical problem or for drug abuse.  Changing or stopping medicines.  Medicines to block the effects of estrogen.  Taking a testosterone replacement.  Surgery to remove breast tissue or any lumps in your breasts.  Breast reduction surgery. This may be a possibility if you have severe or painful gynecomastia.  Follow these instructions at home:  Take over-the-counter and prescription medicines only as told by your health care provider.  Talk to your health care provider before taking any herbal medicines or diet supplements.  Do not abuse drugs or alcohol.  Keep all follow-up visits as told by your health care provider. This is important. Contact a health care provider if:  Your breast tissue grows larger or gets more swollen or painful.  You have a lump in your testicle.  You have blood or discharge coming from your nipples.  Your nipple changes shape.  You develop a hard or painful lump in your breast. This information is not intended to replace advice given to you by your health care provider. Make sure you discuss any questions you have with your health care provider. Document Released: 04/20/2015 Document Revised:  08/03/2015 Document Reviewed: 04/20/2015 Elsevier Interactive Patient Education  2018 Elsevier Inc.      Edwina BarthMiguel Timonthy Hovater, MD [facility]

## 2017-04-28 NOTE — Patient Instructions (Addendum)
IF you received an x-ray today, you will receive an invoice from [facility]. Please contact [facility] at [phone] with questions or concerns regarding your invoice.   IF you received labwork today, you will receive an invoice from [facility]. Please contact [facility] at [phone] with questions or concerns regarding your invoice.   Our billing staff will not be able to assist you with questions regarding bills from these companies.  You will be contacted with the lab results as soon as they are available. The fastest way to get your results is to activate your My Chart account. Instructions are located on the last page of this paperwork. If you have not heard from us regarding the results in 2 weeks, please contact this office.     Gynecomastia, Adult Gynecomastia is an overgrowth of gland tissue in a man's breasts. This may cause one or both breasts to become enlarged. This often develops in men who have an imbalance of the male sex hormone (testosterone) and the male sex hormone (estrogen). This means that a man may have too much estrogen, too little testosterone, or both. Gynecomastia may be a normal part of aging for some men. It can also happen to adolescent boys during puberty. What are the causes? Gynecomastia may be caused by:  Certain medicines, such as: ? Estrogen supplements and medicines that act like estrogen in the body. ? Medicines that keep testosterone from functioning normally in the body (testosterone-inhibiting drugs). ? Anabolic steroids. ? Medicines to treat heartburn, cancer, heart disease, mental health problems, HIV (human immunodeficiency virus) or AIDS (acquired immunodeficiency syndrome). ? Antibiotic medicine. ? Chemotherapy medicine.  Recreational drugs, including alcohol, marijuana, and opioids.  Herbal products, including lavender and tea tree oil.  A gene that is passed along from parent to child (inherited).  Tumors in  the pituitary or adrenal gland.  An overactive thyroid gland.  Certain inherited disorders, including a genetic disease that causes low testosterone in males (Klinefelter syndrome).  Cancer of the lung, kidney, liver, testicle, or gastrointestinal tract.  Conditions that cause liver or kidney failure.  Poor nutrition and starvation.  Testicle shrinking or failure (testicularatrophy).  In some cases, the cause may not be known. What increases the risk? You may have a higher risk for gynecomastia if you:  Are 215 years old or older.  Are overweight.  Abuse alcohol or other drugs.  Have a family history of gynecomastia.  What are the signs or symptoms?  Most of the time, breast enlargement is the only symptom. The enlargement may start near the nipple, and the breast tissue may feel firm and rubbery. The breast may feel itchy, painful or tender. How is this diagnosed? This condition may be diagnosed based on:  Your symptoms.  Your medical history.  A physical exam.  Imaging tests, such as: ? An ultrasound. ? A mammogram. ? An MRI.  Blood tests.  Removal of a sample of breast tissue to be tested in a lab (biopsy).  How is this treated? Gynecomastia may go away on its own, without treatment. If gynecomastia is caused by a medical problem or drug abuse, treatment may include:  Getting treatment for the underlying medical problem or for drug abuse.  Changing or stopping medicines.  Medicines to block the effects of estrogen.  Taking a testosterone replacement.  Surgery to remove breast tissue or any lumps in your breasts.  Breast reduction surgery. This may be a possibility if you have severe or painful gynecomastia.  Follow these instructions at home:  Take over-the-counter and prescription medicines only as told by your health care provider.  Talk to your health care provider before taking any herbal medicines or diet supplements.  Do not abuse drugs or  alcohol.  Keep all follow-up visits as told by your health care provider. This is important. Contact a health care provider if:  Your breast tissue grows larger or gets more swollen or painful.  You have a lump in your testicle.  You have blood or discharge coming from your nipples.  Your nipple changes shape.  You develop a hard or painful lump in your breast. This information is not intended to replace advice given to you by your health care provider. Make sure you discuss any questions you have with your health care provider. Document Released: 04/20/2015 Document Revised: 08/03/2015 Document Reviewed: 04/20/2015 Elsevier Interactive Patient Education  [employer].

## 2017-06-01 ENCOUNTER — Other Ambulatory Visit: Payer: Self-pay | Admitting: Surgery

## 2017-11-12 ENCOUNTER — Ambulatory Visit (INDEPENDENT_AMBULATORY_CARE_PROVIDER_SITE_OTHER): Payer: Managed Care, Other (non HMO) | Admitting: Emergency Medicine

## 2017-11-12 ENCOUNTER — Encounter: Payer: Self-pay | Admitting: Emergency Medicine

## 2017-11-12 ENCOUNTER — Other Ambulatory Visit: Payer: Self-pay

## 2017-11-12 VITALS — BP 97/52 | HR 74 | Temp 98.5°F | Resp 16 | Ht 66.75 in | Wt 128.0 lb

## 2017-11-12 DIAGNOSIS — N62 Hypertrophy of breast: Secondary | ICD-10-CM | POA: Diagnosis not present

## 2017-11-12 NOTE — Progress Notes (Signed)
Samuel Byrd 32 y.o.   Chief Complaint  Patient presents with  . Referral    to general surgeon for breast mass RIGHT, per patient he was out of town missed call    HISTORY OF PRESENT ILLNESS: This is a 33 y.o. male seen by me 04/21/2017 with right-sided gynecomastia.  Had benign mammogram and ultrasound done.  Refer to general surgeon.  Appointment was scheduled but patient missed.  Requesting another referral.  No change in his condition.  Gynecomastia still present still the same.  HPI   Prior to Admission medications   Medication Sig Start Date End Date Taking? Authorizing Provider  pantoprazole (PROTONIX) 40 MG tablet Take 1 tablet (40 mg total) by mouth daily. 04/28/17  Yes Samuel Byrd, Eilleen Kempf, MD  cyclobenzaprine (FLEXERIL) 10 MG tablet Take 1 tablet (10 mg total) by mouth 3 (three) times daily as needed for muscle spasms. Patient not taking: Reported on 04/28/2017 02/04/17   Samuel Quint, MD  ferrous sulfate 325 (65 FE) MG EC tablet Take 1 tablet (325 mg total) by mouth 2 (two) times daily with a meal. Patient not taking: Reported on 04/28/2017 07/26/15   Samuel Simmonds, PA-C  naproxen (NAPROSYN) 500 MG tablet Take 1 tablet (500 mg total) by mouth 2 (two) times daily. Patient not taking: Reported on 11/12/2017 03/12/16   Samuel Fowler, PA-C    No Known Allergies  Patient Active Problem List   Diagnosis Date Noted  . Gynecomastia 04/28/2017  . Dyspnea 04/21/2017  . Breast mass, right 04/21/2017  . Upper back pain 02/04/2017  . Musculoskeletal pain 02/04/2017  . PUD (peptic ulcer disease) 10/09/2016  . Abdominal pain, epigastric 10/09/2016  . Acute upper GI bleed 07/22/2015  . GI bleed 07/22/2015  . Acute blood loss anemia 07/22/2015  . Syncope and collapse 07/22/2015    Past Medical History:  Diagnosis Date  . Ulcer, stomach peptic     Past Surgical History:  Procedure Laterality Date  . ESOPHAGOGASTRODUODENOSCOPY N/A 07/23/2015   Procedure:  ESOPHAGOGASTRODUODENOSCOPY (EGD);  Surgeon: Samuel Rist, MD;  Location: [facility];  Service: Endoscopy;  Laterality: N/A;    Social History   Socioeconomic History  . Marital status: Married    Spouse name: Not on file  . Number of children: 0  . Years of education: Not on file  . Highest education level: Not on file  Occupational History  . Occupation: [employer]  Social Needs  . Financial resource strain: Not on file  . Food insecurity:    Worry: Not on file    Inability: Not on file  . Transportation needs:    Medical: Not on file    Non-medical: Not on file  Tobacco Use  . Smoking status: Light Tobacco Smoker  . Smokeless tobacco: Never Used  . Tobacco comment: occasionally  Substance and Sexual Activity  . Alcohol use: Yes    Alcohol/week: 0.0 standard drinks    Comment: occasionally  . Drug use: No  . Sexual activity: Not on file  Lifestyle  . Physical activity:    Days per week: Not on file    Minutes per session: Not on file  . Stress: Not on file  Relationships  . Social connections:    Talks on phone: Not on file    Gets together: Not on file    Attends religious service: Not on file    Active member of club or organization: Not on file    Attends meetings of  clubs or organizations: Not on file    Relationship status: Not on file  . Intimate partner violence:    Fear of current or ex partner: Not on file    Emotionally abused: Not on file    Physically abused: Not on file    Forced sexual activity: Not on file  Other Topics Concern  . Not on file  Social History Narrative  . Not on file    Family History  Problem Relation Age of Onset  . Heart attack Mother   . Ulcers Brother   . Colon cancer Neg Hx      Review of Systems  Constitutional: Negative.  Negative for chills and fever.  Respiratory: Negative.  Negative for cough and shortness of breath.   Gastrointestinal: Negative for abdominal pain, nausea and vomiting.  Skin:  Negative.  Negative for rash.  Neurological: Negative for dizziness and headaches.  Endo/Heme/Allergies: Negative.     Vitals:   11/12/17 1603  BP: (!) 97/52  Pulse: 74  Resp: 16  Temp: 98.5 F (36.9 C)  SpO2: 98%    Physical Exam  Constitutional: He is oriented to person, place, and time. He appears well-developed and well-nourished.  HENT:  Head: Normocephalic and atraumatic.  Eyes: Pupils are equal, round, and reactive to light. EOM are normal.  Neck: Normal range of motion.  Cardiovascular: Normal rate and regular rhythm.  Pulmonary/Chest: Effort normal and breath sounds normal.  Right-sided gynecomastia.  Nontender.  No erythema  Musculoskeletal: Normal range of motion.  Neurological: He is alert and oriented to person, place, and time.  Skin: Skin is warm and dry. Capillary refill takes less than 2 seconds.  Psychiatric: He has a normal mood and affect.  Vitals reviewed.    ASSESSMENT & PLAN: Samuel Byrd was seen today for referral.  Diagnoses and all orders for this visit:  Gynecomastia -     Ambulatory referral to General Surgery    Patient Instructions       If you have lab work done today you will be contacted with your lab results within the next 2 weeks.  If you have not heard from [place] then please contact us. The fastest way to get your results is to register for My Chart.   IF you received an x-ray today, you will receive an invoice from [facility]. Please contact [facility] at [phone] with questions or concerns regarding your invoice.   IF you received labwork today, you will receive an invoice from [place]. Please contact [facility] at [phone] with questions or concerns regarding your invoice.   Our billing staff will not be able to assist you with questions regarding bills from these companies.  You will be contacted with the lab results as soon as they are available. The fastest way to get your results is to activate  your My Chart account. Instructions are located on the last page of this paperwork. If you have not heard from [place] regarding the results in 2 weeks, please contact this office.     Gynecomastia, Adult Gynecomastia is an overgrowth of gland tissue in a man's breasts. This may cause one or both breasts to become enlarged. This often develops in men who have an imbalance of the male sex hormone (testosterone) and the male sex hormone (estrogen). This means that a man may have too much estrogen, too little testosterone, or both. Gynecomastia may be a normal part of aging for some men. It can also happen to adolescent boys during  puberty. What are the causes? Gynecomastia may be caused by:  Certain medicines, such as: ? Estrogen supplements and medicines that act like estrogen in the body. ? Medicines that keep testosterone from functioning normally in the body (testosterone-inhibiting drugs). ? Anabolic steroids. ? Medicines to treat heartburn, cancer, heart disease, mental health problems, HIV (human immunodeficiency virus) or AIDS (acquired immunodeficiency syndrome). ? Antibiotic medicine. ? Chemotherapy medicine.  Recreational drugs, including alcohol, marijuana, and opioids.  Herbal products, including lavender and tea tree oil.  A gene that is passed along from parent to child (inherited).  Tumors in the pituitary or adrenal gland.  An overactive thyroid gland.  Certain inherited disorders, including a genetic disease that causes low testosterone in males (Klinefelter syndrome).  Cancer of the lung, kidney, liver, testicle, or gastrointestinal tract.  Conditions that cause liver or kidney failure.  Poor nutrition and starvation.  Testicle shrinking or failure (testicularatrophy).  In some cases, the cause may not be known. What increases the risk? You may have a higher risk for gynecomastia if you:  Are 14 years old or older.  Are overweight.  Abuse alcohol or other  drugs.  Have a family history of gynecomastia.  What are the signs or symptoms?  Most of the time, breast enlargement is the only symptom. The enlargement may start near the nipple, and the breast tissue may feel firm and rubbery. The breast may feel itchy, painful or tender. How is this diagnosed? This condition may be diagnosed based on:  Your symptoms.  Your medical history.  A physical exam.  Imaging tests, such as: ? An ultrasound. ? A mammogram. ? An MRI.  Blood tests.  Removal of a sample of breast tissue to be tested in a lab (biopsy).  How is this treated? Gynecomastia may go away on its own, without treatment. If gynecomastia is caused by a medical problem or drug abuse, treatment may include:  Getting treatment for the underlying medical problem or for drug abuse.  Changing or stopping medicines.  Medicines to block the effects of estrogen.  Taking a testosterone replacement.  Surgery to remove breast tissue or any lumps in your breasts.  Breast reduction surgery. This may be a possibility if you have severe or painful gynecomastia.  Follow these instructions at home:  Take over-the-counter and prescription medicines only as told by your health care provider.  Talk to your health care provider before taking any herbal medicines or diet supplements.  Do not abuse drugs or alcohol.  Keep all follow-up visits as told by your health care provider. This is important. Contact a health care provider if:  Your breast tissue grows larger or gets more swollen or painful.  You have a lump in your testicle.  You have blood or discharge coming from your nipples.  Your nipple changes shape.  You develop a hard or painful lump in your breast. This information is not intended to replace advice given to you by your health care provider. Make sure you discuss any questions you have with your health care provider. Document Released: 04/20/2015 Document Revised:  08/03/2015 Document Reviewed: 04/20/2015 Elsevier Interactive Patient Education  2018 Elsevier Inc.      Edwina Barth, MD [facility]

## 2017-11-12 NOTE — Patient Instructions (Addendum)
If you have lab work done today you will be contacted with your lab results within the next 2 weeks.  If you have not heard from [place] then please contact us. The fastest way to get your results is to register for My Chart.   IF you received an x-ray today, you will receive an invoice from [facility]. Please contact [facility] at [phone] with questions or concerns regarding your invoice.   IF you received labwork today, you will receive an invoice from [facility]. Please contact [facility] at [phone] with questions or concerns regarding your invoice.   Our billing staff will not be able to assist you with questions regarding bills from these companies.  You will be contacted with the lab results as soon as they are available. The fastest way to get your results is to activate your My Chart account. Instructions are located on the last page of this paperwork. If you have not heard from [place] regarding the results in 2 weeks, please contact this office.     Gynecomastia, Adult Gynecomastia is an overgrowth of gland tissue in a man's breasts. This may cause one or both breasts to become enlarged. This often develops in men who have an imbalance of the male sex hormone (testosterone) and the male sex hormone (estrogen). This means that a man may have too much estrogen, too little testosterone, or both. Gynecomastia may be a normal part of aging for some men. It can also happen to adolescent boys during puberty. What are the causes? Gynecomastia may be caused by:  Certain medicines, such as: ? Estrogen supplements and medicines that act like estrogen in the body. ? Medicines that keep testosterone from functioning normally in the body (testosterone-inhibiting drugs). ? Anabolic steroids. ? Medicines to treat heartburn, cancer, heart disease, mental health problems, HIV (human immunodeficiency virus) or AIDS (acquired immunodeficiency syndrome). ? Antibiotic  medicine. ? Chemotherapy medicine.  Recreational drugs, including alcohol, marijuana, and opioids.  Herbal products, including lavender and tea tree oil.  A gene that is passed along from parent to child (inherited).  Tumors in the pituitary or adrenal gland.  An overactive thyroid gland.  Certain inherited disorders, including a genetic disease that causes low testosterone in males (Klinefelter syndrome).  Cancer of the lung, kidney, liver, testicle, or gastrointestinal tract.  Conditions that cause liver or kidney failure.  Poor nutrition and starvation.  Testicle shrinking or failure (testicularatrophy).  In some cases, the cause may not be known. What increases the risk? You may have a higher risk for gynecomastia if you:  Are 12 years old or older.  Are overweight.  Abuse alcohol or other drugs.  Have a family history of gynecomastia.  What are the signs or symptoms?  Most of the time, breast enlargement is the only symptom. The enlargement may start near the nipple, and the breast tissue may feel firm and rubbery. The breast may feel itchy, painful or tender. How is this diagnosed? This condition may be diagnosed based on:  Your symptoms.  Your medical history.  A physical exam.  Imaging tests, such as: ? An ultrasound. ? A mammogram. ? An MRI.  Blood tests.  Removal of a sample of breast tissue to be tested in a lab (biopsy).  How is this treated? Gynecomastia may go away on its own, without treatment. If gynecomastia is caused by a medical problem or drug abuse, treatment may include:  Getting treatment for the underlying medical problem or for drug  abuse.  Changing or stopping medicines.  Medicines to block the effects of estrogen.  Taking a testosterone replacement.  Surgery to remove breast tissue or any lumps in your breasts.  Breast reduction surgery. This may be a possibility if you have severe or painful gynecomastia.  Follow  these instructions at home:  Take over-the-counter and prescription medicines only as told by your health care provider.  Talk to your health care provider before taking any herbal medicines or diet supplements.  Do not abuse drugs or alcohol.  Keep all follow-up visits as told by your health care provider. This is important. Contact a health care provider if:  Your breast tissue grows larger or gets more swollen or painful.  You have a lump in your testicle.  You have blood or discharge coming from your nipples.  Your nipple changes shape.  You develop a hard or painful lump in your breast. This information is not intended to replace advice given to you by your health care provider. Make sure you discuss any questions you have with your health care provider. Document Released: 04/20/2015 Document Revised: 08/03/2015 Document Reviewed: 04/20/2015 Elsevier Interactive Patient Education  [employer].

## 2017-11-13 ENCOUNTER — Other Ambulatory Visit: Payer: Self-pay | Admitting: Emergency Medicine

## 2017-11-13 DIAGNOSIS — K279 Peptic ulcer, site unspecified, unspecified as acute or chronic, without hemorrhage or perforation: Secondary | ICD-10-CM

## 2017-11-13 DIAGNOSIS — R1013 Epigastric pain: Secondary | ICD-10-CM

## 2017-11-25 ENCOUNTER — Ambulatory Visit: Payer: Self-pay | Admitting: Surgery

## 2017-11-25 NOTE — H&P (Signed)
History of Present Illness (Samuel Holster K. Caellum Mancil MD; 11/25/2017 2:50 PM) The patient is a 32 year old male who presents with gynecomastia. Referred by Dr. Miguel Sigardia for right gynecomastia  This is a healthy 32-year-old male who presents with a six-month history of asymmetric swelling in his right breast. He has developed fairly significant amounts of right breast tissue. There are no palpable masses within this breast tissue. The patient is otherwise very thin and muscular. No palpable masses on the left side. No swelling of the breast tissue on the left. He has been thoroughly evaluated by his primary care physician. The patient is on no chronic medications. He has no other symptoms that could help determine the etiology of this asymmetric gynecomastia. Mammogram and ultrasound were unremarkable other than a large amount of right breast tissue.  CLINICAL DATA: Patient presents with a 2 month history of right breast swelling and tenderness. He is on no medication and takes no supplements. There is no left breast swelling or tenderness.  EXAM: DIGITAL DIAGNOSTIC BILATERAL MAMMOGRAM WITH CAD AND TOMO  ULTRASOUND RIGHT BREAST  COMPARISON: None  ACR Breast Density Category c: The breast tissue is heterogeneously dense, which may obscure small masses.  FINDINGS: In the right breast, there is increased density throughout the normal subcutaneous fat, centered on the retroareolar region, with an appearance typical of exuberant, glandular pattern type gynecomastia. There are no discrete masses within the area of increased density and no architectural distortion. There are no calcifications.  No abnormality is noted on the left.  Mammographic images were processed with CAD.  On physical exam, the right breast is swollen, but without obvious erythema. There is tenderness to palpation. The breast is soft without a discrete palpable mass.  Targeted ultrasound is performed, showing  heterogeneous tissue in the right breast throughout subcutaneous area anterior to the pectoralis muscle, centered behind the nipple. This has the appearance gynecomastia. There is no discrete mass sonographically.  IMPRESSION: 1. Imaging findings are most consistent with exuberant right breast gynecomastia, which also correlates with the clinical exam. There are no discrete masses to suggest breast carcinoma. Within the differential diagnosis, granulomatous mastitis should be considered.  RECOMMENDATION: 1. Clinical follow-up. Patient was just placed on antibiotic therapy. If symptoms improve, no follow-up imaging would be indicated. Ultrasound-guided biopsy is an option if symptoms do not improve or worsen and remain a diagnostic dilemma.  I have discussed the findings and recommendations with the patient. Results were also provided in writing at the conclusion of the visit. If applicable, a reminder letter will be sent to the patient regarding the next appointment.  BI-RADS CATEGORY 2: Benign.   Electronically Signed By: David Ormond M.D. On: 04/22/2017 14:19   Past Surgical History (Samuel Byrd; 11/25/2017 2:03 PM) No pertinent past surgical history  Diagnostic Studies History (Samuel Byrd; 11/25/2017 2:03 PM) Colonoscopy never  Allergies (Samuel Byrd; 11/25/2017 2:04 PM) No Known Drug Allergies [11/25/2017]: Allergies Reconciled  Medication History (Samuel Byrd; 11/25/2017 2:04 PM) No Current Medications Medications Reconciled  Social History (Samuel Byrd; 11/25/2017 2:03 PM) Alcohol use Occasional alcohol use. Caffeine use Coffee, Tea. No drug use Tobacco use Former smoker.  Family History (Samuel Byrd; 11/25/2017 2:03 PM) Family history unknown First Degree Relatives  Other Problems (Samuel Byrd; 11/25/2017 2:03 PM) Gastric Ulcer     Review of Systems (Samuel Byrd; 11/25/2017 2:03 PM) General Not Present- Appetite Loss,  Chills, Fatigue, Fever, Night Sweats, Weight Gain and Weight Loss. Skin Not Present- Change in Wart/Mole, Dryness,   Hives, Jaundice, New Lesions, Non-Healing Wounds, Rash and Ulcer. HEENT Not Present- Earache, Hearing Loss, Hoarseness, Nose Bleed, Oral Ulcers, Ringing in the Ears, Seasonal Allergies, Sinus Pain, Sore Throat, Visual Disturbances, Wears glasses/contact lenses and Yellow Eyes. Respiratory Not Present- Bloody sputum, Chronic Cough, Difficulty Breathing, Snoring and Wheezing. Breast Present- Breast Pain. Not Present- Breast Mass, Nipple Discharge and Skin Changes. Gastrointestinal Not Present- Abdominal Pain, Bloating, Bloody Stool, Change in Bowel Habits, Chronic diarrhea, Constipation, Difficulty Swallowing, Excessive gas, Gets full quickly at meals, Hemorrhoids, Indigestion, Nausea, Rectal Pain and Vomiting. Male Genitourinary Not Present- Blood in Urine, Change in Urinary Stream, Frequency, Impotence, Nocturia, Painful Urination, Urgency and Urine Leakage.  Vitals (Samuel Byrd; 11/25/2017 2:05 PM) 11/25/2017 2:04 PM Weight: 128.5 lb Height: 66in Body Surface Area: 1.66 m Body Mass Index: 20.74 kg/m  Temp.: 98.4F(Oral)  Pulse: 78 (Regular)  BP: 110/62 (Sitting, Left Arm, Standard)      Physical Exam ([name] MD; 11/25/2017 2:51 PM)  The physical exam findings are as follows: Note:WDWN in NAD Eyes: Pupils equal, round; sclera anicteric HENT: Oral mucosa moist; good dentition Neck: No masses palpated, no thyromegaly Lungs: CTA bilaterally; normal respiratory effort Chest: left chest - no palpable breast tissue, very little subcutaneous adipose tissue Right chest - well-demarcated breast tissue; noticeably asymmetric from the left; palpable axillary tail. No palpable masses within the breast tissue. No overlying skin changes CV: Regular rate and rhythm; no murmurs; extremities well-perfused with no edema Abd: +bowel sounds, soft, non-tender,  no palpable organomegaly; small protruding, reducible umbilical hernia Skin: Warm, dry; no sign of jaundice Psychiatric - alert and oriented x 4; calm mood and affect    Assessment & Plan ([name] MD; 11/25/2017 2:51 PM)  GYNECOMASTIA (N62) Impression: idiopathic/ right side  Current Plans Schedule for Surgery - Right subcutaneous mastectomy. The surgical procedure has been discussed with the patient. Potential risks, benefits, alternative treatments, and expected outcomes have been explained. All of the patient's questions at this time have been answered. The likelihood of reaching the patient's treatment goal is good. The patient understand the proposed surgical procedure and wishes to proceed. He understands that he will have a drain for several days post-op.  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  Dyshawn Cangelosi K. Ladesha Pacini, MD, FACS [facility]  General/ Trauma Surgery Beeper [phone]  11/25/2017 2:52 PM   

## 2017-11-25 NOTE — H&P (View-Only) (Signed)
History of Present Illness Samuel Byrd(Samuel Hasley K. Amun Stemm MD; 11/25/2017 2:50 PM) The patient is a 32 year old male who presents with gynecomastia. Referred by Dr. Elder NegusMiguel Sigardia for right gynecomastia  This is a healthy 32 year old male who presents with a six-month history of asymmetric swelling in his right breast. He has developed fairly significant amounts of right breast tissue. There are no palpable masses within this breast tissue. The patient is otherwise very thin and muscular. No palpable masses on the left side. No swelling of the breast tissue on the left. He has been thoroughly evaluated by his primary care physician. The patient is on no chronic medications. He has no other symptoms that could help determine the etiology of this asymmetric gynecomastia. Mammogram and ultrasound were unremarkable other than a large amount of right breast tissue.  CLINICAL DATA: Patient presents with a 2 month history of right breast swelling and tenderness. He is on no medication and takes no supplements. There is no left breast swelling or tenderness.  EXAM: DIGITAL DIAGNOSTIC BILATERAL MAMMOGRAM WITH CAD AND TOMO  ULTRASOUND RIGHT BREAST  COMPARISON: None  ACR Breast Density Category c: The breast tissue is heterogeneously dense, which may obscure small masses.  FINDINGS: In the right breast, there is increased density throughout the normal subcutaneous fat, centered on the retroareolar region, with an appearance typical of exuberant, glandular pattern type gynecomastia. There are no discrete masses within the area of increased density and no architectural distortion. There are no calcifications.  No abnormality is noted on the left.  Mammographic images were processed with CAD.  On physical exam, the right breast is swollen, but without obvious erythema. There is tenderness to palpation. The breast is soft without a discrete palpable mass.  Targeted ultrasound is performed, showing  heterogeneous tissue in the right breast throughout subcutaneous area anterior to the pectoralis muscle, centered behind the nipple. This has the appearance gynecomastia. There is no discrete mass sonographically.  IMPRESSION: 1. Imaging findings are most consistent with exuberant right breast gynecomastia, which also correlates with the clinical exam. There are no discrete masses to suggest breast carcinoma. Within the differential diagnosis, granulomatous mastitis should be considered.  RECOMMENDATION: 1. Clinical follow-up. Patient was just placed on antibiotic therapy. If symptoms improve, no follow-up imaging would be indicated. Ultrasound-guided biopsy is an option if symptoms do not improve or worsen and remain a diagnostic dilemma.  I have discussed the findings and recommendations with the patient. Results were also provided in writing at the conclusion of the visit. If applicable, a reminder letter will be sent to the patient regarding the next appointment.  BI-RADS CATEGORY 2: Benign.   Electronically Signed By: Amie Portlandavid Ormond M.D. On: 04/22/2017 14:19   Past Surgical History Maurilio Lovely([name]; 11/25/2017 2:03 PM) No pertinent past surgical history  Diagnostic Studies History Maurilio Lovely([name]; 11/25/2017 2:03 PM) Colonoscopy never  Allergies Maurilio Lovely([name]; 11/25/2017 2:04 PM) No Known Drug Allergies [11/25/2017]: Allergies Reconciled  Medication History Maurilio Lovely([name]; 11/25/2017 2:04 PM) No Current Medications Medications Reconciled  Social History Maurilio Lovely([name]; 11/25/2017 2:03 PM) Alcohol use Occasional alcohol use. Caffeine use Coffee, Tea. No drug use Tobacco use Former smoker.  Family History Maurilio Lovely([name]; 11/25/2017 2:03 PM) Family history unknown First Degree Relatives  Other Problems Maurilio Lovely([name]; 11/25/2017 2:03 PM) Gastric Ulcer     Review of Systems Maurilio Lovely([name]; 11/25/2017 2:03 PM) General Not Present- Appetite Loss,  Chills, Fatigue, Fever, Night Sweats, Weight Gain and Weight Loss. Skin Not Present- Change in Wart/Mole, Dryness,  Hives, Jaundice, New Lesions, Non-Healing Wounds, Rash and Ulcer. HEENT Not Present- Earache, Hearing Loss, Hoarseness, Nose Bleed, Oral Ulcers, Ringing in the Ears, Seasonal Allergies, Sinus Pain, Sore Throat, Visual Disturbances, Wears glasses/contact lenses and Yellow Eyes. Respiratory Not Present- Bloody sputum, Chronic Cough, Difficulty Breathing, Snoring and Wheezing. Breast Present- Breast Pain. Not Present- Breast Mass, Nipple Discharge and Skin Changes. Gastrointestinal Not Present- Abdominal Pain, Bloating, Bloody Stool, Change in Bowel Habits, Chronic diarrhea, Constipation, Difficulty Swallowing, Excessive gas, Gets full quickly at meals, Hemorrhoids, Indigestion, Nausea, Rectal Pain and Vomiting. Male Genitourinary Not Present- Blood in Urine, Change in Urinary Stream, Frequency, Impotence, Nocturia, Painful Urination, Urgency and Urine Leakage.  Vitals Maurilio Lovely; 11/25/2017 2:05 PM) 11/25/2017 2:04 PM Weight: 128.5 lb Height: 66in Body Surface Area: 1.66 m Body Mass Index: 20.74 kg/m  Temp.: 98.30F(Oral)  Pulse: 78 (Regular)  BP: 110/62 (Sitting, Left Arm, Standard)      Physical Exam Molli Hazard K. Evamarie Raetz MD; 11/25/2017 2:51 PM)  The physical exam findings are as follows: Note:WDWN in NAD Eyes: Pupils equal, round; sclera anicteric HENT: Oral mucosa moist; good dentition Neck: No masses palpated, no thyromegaly Lungs: CTA bilaterally; normal respiratory effort Chest: left chest - no palpable breast tissue, very little subcutaneous adipose tissue Right chest - well-demarcated breast tissue; noticeably asymmetric from the left; palpable axillary tail. No palpable masses within the breast tissue. No overlying skin changes CV: Regular rate and rhythm; no murmurs; extremities well-perfused with no edema Abd: +bowel sounds, soft, non-tender,  no palpable organomegaly; small protruding, reducible umbilical hernia Skin: Warm, dry; no sign of jaundice Psychiatric - alert and oriented x 4; calm mood and affect    Assessment & Plan Molli Hazard K. Mayreli Alden MD; 11/25/2017 2:51 PM)  Lavonia Drafts (N62) Impression: idiopathic/ right side  Current Plans Schedule for Surgery - Right subcutaneous mastectomy. The surgical procedure has been discussed with the patient. Potential risks, benefits, alternative treatments, and expected outcomes have been explained. All of the patient's questions at this time have been answered. The likelihood of reaching the patient's treatment goal is good. The patient understand the proposed surgical procedure and wishes to proceed. He understands that he will have a drain for several days post-op.  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  Samuel Byrd. Corliss Skains, MD, [facility] Surgery  General/ Trauma Surgery Beeper [phone]  11/25/2017 2:52 PM

## 2017-12-08 DIAGNOSIS — N62 Hypertrophy of breast: Secondary | ICD-10-CM

## 2017-12-08 HISTORY — DX: Hypertrophy of breast: N62

## 2017-12-15 ENCOUNTER — Other Ambulatory Visit: Payer: Self-pay

## 2017-12-15 ENCOUNTER — Encounter (HOSPITAL_BASED_OUTPATIENT_CLINIC_OR_DEPARTMENT_OTHER): Payer: Self-pay | Admitting: *Deleted

## 2017-12-15 NOTE — Pre-Procedure Instructions (Signed)
To come pick up Ensure pre-surgery drink 10 oz. - to drink by 0600 DOS.

## 2017-12-18 NOTE — Progress Notes (Addendum)
Patient was given pre-surgery drink (Ensure) and instructed to take the morning of surgery.  Patient verbalized understanding.

## 2017-12-23 ENCOUNTER — Ambulatory Visit (HOSPITAL_BASED_OUTPATIENT_CLINIC_OR_DEPARTMENT_OTHER): Payer: Managed Care, Other (non HMO) | Admitting: Anesthesiology

## 2017-12-23 ENCOUNTER — Encounter (HOSPITAL_BASED_OUTPATIENT_CLINIC_OR_DEPARTMENT_OTHER): Payer: Self-pay | Admitting: *Deleted

## 2017-12-23 ENCOUNTER — Encounter (HOSPITAL_BASED_OUTPATIENT_CLINIC_OR_DEPARTMENT_OTHER)
Admission: RE | Disposition: A | Discharge status: Home / Self Care | Payer: Self-pay | Source: Ambulatory Visit | Attending: Surgery

## 2017-12-23 ENCOUNTER — Ambulatory Visit (HOSPITAL_BASED_OUTPATIENT_CLINIC_OR_DEPARTMENT_OTHER)
Admission: RE | Admit: 2017-12-23 | Discharge: 2017-12-23 | Disposition: A | Discharge status: Home / Self Care | Payer: Managed Care, Other (non HMO) | Source: Ambulatory Visit | Attending: Surgery | Admitting: Surgery

## 2017-12-23 ENCOUNTER — Other Ambulatory Visit: Payer: Self-pay

## 2017-12-23 DIAGNOSIS — K429 Umbilical hernia without obstruction or gangrene: Secondary | ICD-10-CM | POA: Insufficient documentation

## 2017-12-23 DIAGNOSIS — Z87891 Personal history of nicotine dependence: Secondary | ICD-10-CM | POA: Diagnosis not present

## 2017-12-23 DIAGNOSIS — N62 Hypertrophy of breast: Secondary | ICD-10-CM | POA: Diagnosis present

## 2017-12-23 HISTORY — DX: Hypertrophy of breast: N62

## 2017-12-23 HISTORY — PX: GYNECOMASTIA MASTECTOMY: SHX5265

## 2017-12-23 SURGERY — MASTECTOMY, FOR GYNECOMASTIA
Anesthesia: General | Site: Breast | Laterality: Right

## 2017-12-23 MED ORDER — MIDAZOLAM HCL 2 MG/2ML IJ SOLN
INTRAMUSCULAR | Status: AC
  Filled 2017-12-23: qty 2

## 2017-12-23 MED ORDER — ACETAMINOPHEN 500 MG PO TABS
ORAL_TABLET | ORAL | Status: AC
  Filled 2017-12-23: qty 2

## 2017-12-23 MED ORDER — OXYCODONE HCL 5 MG/5ML PO SOLN: 5.0000 mg | Freq: Once | ORAL | Status: DC | PRN

## 2017-12-23 MED ORDER — FENTANYL CITRATE (PF) 100 MCG/2ML IJ SOLN: 25.0000 ug | INTRAMUSCULAR | Status: DC | PRN

## 2017-12-23 MED ORDER — DEXAMETHASONE SODIUM PHOSPHATE 10 MG/ML IJ SOLN
INTRAMUSCULAR | Status: AC
  Filled 2017-12-23: qty 1

## 2017-12-23 MED ORDER — PHENYLEPHRINE HCL 10 MG/ML IJ SOLN
INTRAMUSCULAR | Status: DC | PRN
  Administered 2017-12-23 (×3): 80 ug via INTRAVENOUS

## 2017-12-23 MED ORDER — PROPOFOL 10 MG/ML IV BOLUS
INTRAVENOUS | Status: AC
  Filled 2017-12-23: qty 20

## 2017-12-23 MED ORDER — CHLORHEXIDINE GLUCONATE CLOTH 2 % EX PADS: 6.0000 | MEDICATED_PAD | Freq: Once | CUTANEOUS | Status: DC

## 2017-12-23 MED ORDER — GABAPENTIN 300 MG PO CAPS
300.0000 mg | ORAL_CAPSULE | ORAL | Status: AC
  Administered 2017-12-23: 300 mg via ORAL

## 2017-12-23 MED ORDER — LIDOCAINE HCL (CARDIAC) PF 100 MG/5ML IV SOSY
PREFILLED_SYRINGE | INTRAVENOUS | Status: DC | PRN
  Administered 2017-12-23: 100 mg via INTRAVENOUS

## 2017-12-23 MED ORDER — OXYCODONE HCL 5 MG PO TABS: 5.0000 mg | ORAL_TABLET | Freq: Once | ORAL | Status: DC | PRN

## 2017-12-23 MED ORDER — SCOPOLAMINE 1 MG/3DAYS TD PT72: 1.0000 | MEDICATED_PATCH | Freq: Once | TRANSDERMAL | Status: DC | PRN

## 2017-12-23 MED ORDER — GABAPENTIN 300 MG PO CAPS
ORAL_CAPSULE | ORAL | Status: AC
  Filled 2017-12-23: qty 1

## 2017-12-23 MED ORDER — PROPOFOL 10 MG/ML IV BOLUS
INTRAVENOUS | Status: DC | PRN
  Administered 2017-12-23: 200 mg via INTRAVENOUS

## 2017-12-23 MED ORDER — ONDANSETRON HCL 4 MG/2ML IJ SOLN: 4.0000 mg | Freq: Once | INTRAMUSCULAR | Status: DC | PRN

## 2017-12-23 MED ORDER — OXYCODONE HCL 5 MG PO TABS: 5.0000 mg | ORAL_TABLET | Freq: Four times a day (QID) | ORAL | 0 refills | Status: DC | PRN

## 2017-12-23 MED ORDER — FENTANYL CITRATE (PF) 100 MCG/2ML IJ SOLN
INTRAMUSCULAR | Status: AC
  Filled 2017-12-23: qty 4

## 2017-12-23 MED ORDER — CEFAZOLIN SODIUM-DEXTROSE 2-4 GM/100ML-% IV SOLN
2.0000 g | INTRAVENOUS | Status: AC
  Administered 2017-12-23: 2 g via INTRAVENOUS

## 2017-12-23 MED ORDER — CEFAZOLIN SODIUM-DEXTROSE 2-4 GM/100ML-% IV SOLN
INTRAVENOUS | Status: AC
  Filled 2017-12-23: qty 100

## 2017-12-23 MED ORDER — PHENYLEPHRINE 40 MCG/ML (10ML) SYRINGE FOR IV PUSH (FOR BLOOD PRESSURE SUPPORT)
PREFILLED_SYRINGE | INTRAVENOUS | Status: AC
  Filled 2017-12-23: qty 10

## 2017-12-23 MED ORDER — ACETAMINOPHEN 500 MG PO TABS
1000.0000 mg | ORAL_TABLET | ORAL | Status: AC
  Administered 2017-12-23: 1000 mg via ORAL

## 2017-12-23 MED ORDER — FENTANYL CITRATE (PF) 100 MCG/2ML IJ SOLN
50.0000 ug | INTRAMUSCULAR | Status: AC | PRN
  Administered 2017-12-23 (×2): 50 ug via INTRAVENOUS
  Administered 2017-12-23: 100 ug via INTRAVENOUS

## 2017-12-23 MED ORDER — DEXAMETHASONE SODIUM PHOSPHATE 10 MG/ML IJ SOLN
INTRAMUSCULAR | Status: DC | PRN
  Administered 2017-12-23: 8 mg via INTRAVENOUS

## 2017-12-23 MED ORDER — ONDANSETRON HCL 4 MG/2ML IJ SOLN
INTRAMUSCULAR | Status: AC
  Filled 2017-12-23: qty 2

## 2017-12-23 MED ORDER — MIDAZOLAM HCL 2 MG/2ML IJ SOLN
1.0000 mg | INTRAMUSCULAR | Status: DC | PRN
  Administered 2017-12-23: 2 mg via INTRAVENOUS

## 2017-12-23 MED ORDER — BUPIVACAINE-EPINEPHRINE 0.25% -1:200000 IJ SOLN
INTRAMUSCULAR | Status: DC | PRN
  Administered 2017-12-23: 20 mL

## 2017-12-23 MED ORDER — LACTATED RINGERS IV SOLN
INTRAVENOUS | Status: DC
  Administered 2017-12-23: 08:00:00 via INTRAVENOUS

## 2017-12-23 MED ORDER — LIDOCAINE 2% (20 MG/ML) 5 ML SYRINGE
INTRAMUSCULAR | Status: AC
  Filled 2017-12-23: qty 5

## 2017-12-23 MED ORDER — ONDANSETRON HCL 4 MG/2ML IJ SOLN
INTRAMUSCULAR | Status: DC | PRN
  Administered 2017-12-23: 4 mg via INTRAVENOUS

## 2017-12-23 SURGICAL SUPPLY — 49 items
APPLICATOR COTTON TIP 6 STRL (MISCELLANEOUS) IMPLANT
APPLICATOR COTTON TIP 6IN STRL (MISCELLANEOUS)
APPLIER CLIP 9.375 MED OPEN (MISCELLANEOUS)
BENZOIN TINCTURE PRP APPL 2/3 (GAUZE/BANDAGES/DRESSINGS) ×2 IMPLANT
BLADE HEX COATED 2.75 (ELECTRODE) ×2 IMPLANT
BLADE SURG 15 STRL LF DISP TIS (BLADE) ×1 IMPLANT
BLADE SURG 15 STRL SS (BLADE) ×1
CANISTER SUCT 1200ML W/VALVE (MISCELLANEOUS) ×2 IMPLANT
CHLORAPREP W/TINT 26ML (MISCELLANEOUS) ×2 IMPLANT
CLIP APPLIE 9.375 MED OPEN (MISCELLANEOUS) IMPLANT
COVER BACK TABLE 60X90IN (DRAPES) ×2 IMPLANT
COVER MAYO STAND STRL (DRAPES) ×2 IMPLANT
COVER WAND RF STERILE (DRAPES) IMPLANT
DECANTER SPIKE VIAL GLASS SM (MISCELLANEOUS) IMPLANT
DEVICE DUBIN W/COMP PLATE 8390 (MISCELLANEOUS) IMPLANT
DRAPE LAPAROTOMY 100X72 PEDS (DRAPES) ×2 IMPLANT
DRAPE UTILITY XL STRL (DRAPES) ×2 IMPLANT
DRSG TEGADERM 4X4.75 (GAUZE/BANDAGES/DRESSINGS) ×2 IMPLANT
ELECT REM PT RETURN 9FT ADLT (ELECTROSURGICAL) ×2
ELECTRODE REM PT RTRN 9FT ADLT (ELECTROSURGICAL) ×1 IMPLANT
GAUZE SPONGE 4X4 12PLY STRL LF (GAUZE/BANDAGES/DRESSINGS) ×2 IMPLANT
GLOVE BIO SURGEON STRL SZ 6.5 (GLOVE) ×2 IMPLANT
GLOVE BIO SURGEON STRL SZ7 (GLOVE) ×2 IMPLANT
GLOVE BIOGEL PI IND STRL 7.0 (GLOVE) ×1 IMPLANT
GLOVE BIOGEL PI IND STRL 7.5 (GLOVE) ×1 IMPLANT
GLOVE BIOGEL PI IND STRL 8 (GLOVE) ×1 IMPLANT
GLOVE BIOGEL PI INDICATOR 7.0 (GLOVE) ×1
GLOVE BIOGEL PI INDICATOR 7.5 (GLOVE) ×1
GLOVE BIOGEL PI INDICATOR 8 (GLOVE) ×1
GOWN STRL REUS W/ TWL LRG LVL3 (GOWN DISPOSABLE) ×2 IMPLANT
GOWN STRL REUS W/TWL LRG LVL3 (GOWN DISPOSABLE) ×2
KIT MARKER MARGIN INK (KITS) IMPLANT
NEEDLE HYPO 25X1 1.5 SAFETY (NEEDLE) ×2 IMPLANT
NS IRRIG 1000ML POUR BTL (IV SOLUTION) ×2 IMPLANT
PACK BASIN DAY SURGERY FS (CUSTOM PROCEDURE TRAY) ×2 IMPLANT
PENCIL BUTTON HOLSTER BLD 10FT (ELECTRODE) ×2 IMPLANT
SLEEVE SCD COMPRESS KNEE MED (MISCELLANEOUS) ×2 IMPLANT
SPONGE LAP 4X18 RFD (DISPOSABLE) ×2 IMPLANT
STRIP CLOSURE SKIN 1/2X4 (GAUZE/BANDAGES/DRESSINGS) ×2 IMPLANT
SUT CHROMIC 3 0 SH 27 (SUTURE) IMPLANT
SUT MON AB 4-0 PC3 18 (SUTURE) ×2 IMPLANT
SUT SILK 2 0 SH (SUTURE) IMPLANT
SUT VIC AB 3-0 SH 27 (SUTURE) ×1
SUT VIC AB 3-0 SH 27X BRD (SUTURE) ×1 IMPLANT
SYR CONTROL 10ML LL (SYRINGE) ×2 IMPLANT
TOWEL GREEN STERILE FF (TOWEL DISPOSABLE) ×2 IMPLANT
TOWEL OR NON WOVEN STRL DISP B (DISPOSABLE) ×2 IMPLANT
TUBE CONNECTING 20X1/4 (TUBING) ×2 IMPLANT
YANKAUER SUCT BULB TIP NO VENT (SUCTIONS) IMPLANT

## 2017-12-23 NOTE — Interval H&P Note (Signed)
History and Physical Interval Note:  12/23/2017 8:26 AM  Samuel Byrd  has presented today for surgery, with the diagnosis of RIGHT GYNECOMASTIA  The various methods of treatment have been discussed with the patient and family. After consideration of risks, benefits and other options for treatment, the patient has consented to  Procedure(s): RIGHT SUBCUTANEOUS MASTECTOMY GYNECOMASTIA (N/A) as a surgical intervention .  The patient's history has been reviewed, patient examined, no change in status, stable for surgery.  I have reviewed the patient's chart and labs.  Questions were answered to the patient's satisfaction.     Wynona Luna

## 2017-12-23 NOTE — Anesthesia Procedure Notes (Signed)
Procedure Name: LMA Insertion Date/Time: 12/23/2017 9:48 AM Performed by: Shanon Payor, CRNA Pre-anesthesia Checklist: Patient identified, Emergency Drugs available, Suction available, Patient being monitored and Timeout performed Patient Re-evaluated:Patient Re-evaluated prior to induction Oxygen Delivery Method: Circle system utilized Preoxygenation: Pre-oxygenation with 100% oxygen Induction Type: IV induction LMA: LMA inserted LMA Size: 4.0 Number of attempts: 1 Placement Confirmation: positive ETCO2 and breath sounds checked- equal and bilateral Tube secured with: Tape Dental Injury: Teeth and Oropharynx as per pre-operative assessment

## 2017-12-23 NOTE — Op Note (Signed)
Preop diagnosis: Right gynecomastia Postop diagnosis: Same Procedure performed: Right subcutaneous mastectomy (nipple sparing) Surgeon:Loetta Connelley K Dao Mearns Anesthesia: General via LMA Indications:This is a healthy 32 year old male who presents with a six-month history of asymmetric swelling in his right breast. He has developed fairly significant amounts of right breast tissue. There are no palpable masses within this breast tissue. The patient is otherwise very thin and muscular. No palpable masses on the left side. No swelling of the breast tissue on the left. He has been thoroughly evaluated by his primary care physician. The patient is on no chronic medications. He has no other symptoms that could help determine the etiology of this asymmetric gynecomastia. Mammogram and ultrasound were unremarkable other than a large amount of right breast tissue  Description of procedure: The patient was brought to the operating room and placed in the supine position on the operating room table.  After an adequate level of general anesthesia was obtained, the patient's right chest was prepped with ChloraPrep and draped sterile fashion.  A timeout was taken to ensure the proper patient and proper procedure.  I used a skin marker to outline the edges of the protruding breast tissue on the right.  We began at the lower edge of the breast tissue.  I made a transverse incision here.  We dissected down to the underlying fascia.  We then dissected off the breast tissue off of the underlying pectoralis fascia.  Once all the breast tissue had been lifted off of the underlying muscle we then dissected the anterior surface of the breast tissue off of the overlying dermis.  We excised all the palpable tissue.  Hemostasis was obtained with cautery.  We inspected carefully for hemostasis.  A small round Blake drain was inserted through a stab incision and placed in the mastectomy site.  This was secured with 3-0 nylon.  The  incision was closed with 3-0 Vicryl and a subcuticular layer 4-0 Monocryl.  Benzoin Steri-Strips were applied.  The patient was then extubated and brought to the recovery room in stable condition.  All sponge, instrument, and needle counts are correct.  Wilmon Arms. Corliss Skains, MD, [facility] Surgery  General/ Trauma Surgery Beeper [phone]  12/23/2017 10:45 AM

## 2017-12-23 NOTE — Anesthesia Preprocedure Evaluation (Signed)
Anesthesia Evaluation  Patient identified by MRN, date of birth, ID band Patient awake    Reviewed: Allergy & Precautions, NPO status , Patient's Chart, lab work & pertinent test results  History of Anesthesia Complications Negative for: history of anesthetic complications  Airway Mallampati: II  TM Distance: >3 FB Neck ROM: Full    Dental no notable dental hx.    Pulmonary neg pulmonary ROS, former smoker,    Pulmonary exam normal        Cardiovascular negative cardio ROS Normal cardiovascular exam     Neuro/Psych negative neurological ROS  negative psych ROS   GI/Hepatic Neg liver ROS, PUD,   Endo/Other  negative endocrine ROS  Renal/GU negative Renal ROS  negative genitourinary   Musculoskeletal negative musculoskeletal ROS (+)   Abdominal   Peds  Hematology negative hematology ROS (+)   Anesthesia Other Findings   Reproductive/Obstetrics                             Anesthesia Physical Anesthesia Plan  ASA: II  Anesthesia Plan: General   Post-op Pain Management:    Induction: Intravenous  PONV Risk Score and Plan: 2 and Ondansetron, Dexamethasone and Treatment may vary due to age or medical condition  Airway Management Planned: LMA  Additional Equipment: None  Intra-op Plan:   Post-operative Plan: Extubation in OR  Informed Consent: I have reviewed the patients History and Physical, chart, labs and discussed the procedure including the risks, benefits and alternatives for the proposed anesthesia with the patient or authorized representative who has indicated his/her understanding and acceptance.     Plan Discussed with:   Anesthesia Plan Comments:         Anesthesia Quick Evaluation

## 2017-12-23 NOTE — Discharge Instructions (Signed)
[facility] [place] °[phone] ° °MASTECTOMY: POST OP INSTRUCTIONS ° °Always review your discharge instruction sheet given to you by the facility where your surgery was performed. °IF YOU HAVE DISABILITY OR FAMILY LEAVE FORMS, YOU MUST BRING THEM TO THE OFFICE FOR PROCESSING.   °DO NOT GIVE THEM TO YOUR DOCTOR. °A prescription for pain medication may be given to you upon discharge.  Take your pain medication as prescribed, if needed.  If narcotic pain medicine is not needed, then you may take acetaminophen (Tylenol) or ibuprofen (Advil) as needed. °1. Take your usually prescribed medications unless otherwise directed. °2. If you need a refill on your pain medication, please contact your pharmacy.  They will contact our office to request authorization.  Prescriptions will not be filled after 5pm or on week-ends. °3. You should follow a light diet the first few days after arrival home, such as soup and crackers, etc.  Resume your normal diet the day after surgery. °4. Most patients will experience some swelling and bruising on the chest and underarm.  Ice packs will help.  Swelling and bruising can take several days to resolve.  °5. It is common to experience some constipation if taking pain medication after surgery.  Increasing fluid intake and taking a stool softener (such as Colace) will usually help or prevent this problem from occurring.  A mild laxative (Milk of Magnesia or Miralax) should be taken according to package instructions if there are no bowel movements after 48 hours. °6. Unless discharge instructions indicate otherwise, leave your bandage dry and in place until your next appointment in 3-5 days.  You may take a limited sponge bath.  No tube baths or showers until the drains are removed.  You may have steri-strips (small skin tapes) in place directly over the incision.  These strips should be left on the skin for 7-10 days.  If your surgeon used skin glue on the incision, you may  shower in 24 hours.  The glue will flake off over the next 2-3 weeks.  Any sutures or staples will be removed at the office during your follow-up visit. °7. DRAINS:  If you have drains in place, it is important to keep a list of the amount of drainage produced each day in your drains.  Before leaving the hospital, you should be instructed on drain care.  Call our office if you have any questions about your drains. °8. ACTIVITIES:  You may resume regular (light) daily activities beginning the next day--such as daily self-care, walking, climbing stairs--gradually increasing activities as tolerated.  You may have sexual intercourse when it is comfortable.  Refrain from any heavy lifting or straining until approved by your doctor. °a. You may drive when you are no longer taking prescription pain medication, you can comfortably wear a seatbelt, and you can safely maneuver your car and apply brakes. °b. RETURN TO WORK:  __________________________________________________________ °9. You should see your doctor in the office for a follow-up appointment approximately 3-5 days after your surgery.  Your doctor’s nurse will typically make your follow-up appointment when she calls you with your pathology report.  Expect your pathology report 2-3 business days after your surgery.  You may call to check if you do not hear from us after three days.   °10. OTHER INSTRUCTIONS: ______________________________________________________________________________________________ ____________________________________________________________________________________________ °WHEN TO CALL YOUR DOCTOR: °1. Fever over 101.0 °2. Nausea and/or vomiting °3. Extreme swelling or bruising °4. Continued bleeding from incision. °5. Increased pain, redness, or drainage from the incision. °  The clinic staff is available to answer your questions during regular business hours.  Please dont hesitate to call and ask to speak to one of the nurses for clinical  concerns.  If you have a medical emergency, go to the nearest emergency room or call 911.  A surgeon from [facility] is always on call at the hospital. [address], [place], [place]  [address] ? [address] [address], [place], [place]   [address] [phone] ? [phone] ? FAX [phone] Web site: [email or web address]     Post Anesthesia Home Care Instructions  Activity: Get plenty of rest for the remainder of the day. A responsible individual must stay with you for 24 hours following the procedure.  For the next 24 hours, DO NOT: -Drive a car -Advertising copywriter -Drink alcoholic beverages -Take any medication unless instructed by your physician -Make any legal decisions or sign important papers.  Meals: Start with liquid foods such as gelatin or soup. Progress to regular foods as tolerated. Avoid greasy, spicy, heavy foods. If nausea and/or vomiting occur, drink only clear liquids until the nausea and/or vomiting subsides. Call your physician if vomiting continues.  Special Instructions/Symptoms: Your throat may feel dry or sore from the anesthesia or the breathing tube placed in your throat during surgery. If this causes discomfort, gargle with warm salt water. The discomfort should disappear within 24 hours.  If you had a scopolamine patch placed behind your ear for the management of post- operative nausea and/or vomiting:  1. The medication in the patch is effective for 72 hours, after which it should be removed.  Wrap patch in a tissue and discard in the trash. Wash hands thoroughly with soap and water. 2. You may remove the patch earlier than 72 hours if you experience unpleasant side effects which may include dry mouth, dizziness or visual disturbances. 3. Avoid touching the patch. Wash your hands with soap and water after contact with the patch.     About my Jackson-Pratt Bulb Drain  What is a Jackson-Pratt bulb? A Jackson-Pratt is a soft, round device  used to collect drainage. It is connected to a long, thin drainage catheter, which is held in place by one or two small stiches near your surgical incision site. When the bulb is squeezed, it forms a vacuum, forcing the drainage to empty into the bulb.  Emptying the Jackson-Pratt bulb- To empty the bulb: 1. Release the plug on the top of the bulb. 2. Pour the bulb's contents into a measuring container which your nurse will provide. 3. Record the time emptied and amount of drainage. Empty the drain(s) as often as your     doctor or nurse recommends.  Date                  Time                    Amount (Drain 1)                 Amount (Drain 2)  _____________________________________________________________________  _____________________________________________________________________  _____________________________________________________________________  _____________________________________________________________________  _____________________________________________________________________  _____________________________________________________________________  _____________________________________________________________________  _____________________________________________________________________  Squeezing the Jackson-Pratt Bulb- To squeeze the bulb: 1. Make sure the plug at the top of the bulb is open. 2. Squeeze the bulb tightly in your fist. You will hear air squeezing from the bulb. 3. Replace the plug while the bulb is squeezed. 4. Use a safety pin to attach the bulb to your clothing.  This will keep the catheter from     pulling at the bulb insertion site.  When to call your doctor- Call your doctor if:  Drain site becomes red, swollen or hot.  You have a fever greater than 101 degrees F.  There is oozing at the drain site.  Drain falls out (apply a guaze bandage over the drain hole and secure it with tape).  Drainage increases daily not related to activity  patterns. (You will usually have more drainage when you are active than when you are resting.)  Drainage has a bad odor.  About my Jackson-Pratt Bulb Drain  What is a Jackson-Pratt bulb? A Jackson-Pratt is a soft, round device used to collect drainage. It is connected to a long, thin drainage catheter, which is held in place by one or two small stiches near your surgical incision site. When the bulb is squeezed, it forms a vacuum, forcing the drainage to empty into the bulb.  Emptying the Jackson-Pratt bulb- To empty the bulb: 1. Release the plug on the top of the bulb. 2. Pour the bulb's contents into a measuring container which your nurse will provide. 3. Record the time emptied and amount of drainage. Empty the drain(s) as often as your     doctor or nurse recommends.  Date                  Time                    Amount (Drain 1)                 Amount (Drain 2)  _____________________________________________________________________  _____________________________________________________________________  _____________________________________________________________________  _____________________________________________________________________  _____________________________________________________________________  _____________________________________________________________________  _____________________________________________________________________  _____________________________________________________________________  Squeezing the Jackson-Pratt Bulb- To squeeze the bulb: 1. Make sure the plug at the top of the bulb is open. 2. Squeeze the bulb tightly in your fist. You will hear air squeezing from the bulb. 3. Replace the plug while the bulb is squeezed. 4. Use a safety pin to attach the bulb to your clothing. This will keep the catheter from     pulling at the bulb insertion site.  When to call your doctor- Call your doctor if:  Drain site becomes red, swollen or  hot.  You have a fever greater than 101 degrees F.  There is oozing at the drain site.  Drain falls out (apply a guaze bandage over the drain hole and secure it with tape).  Drainage increases daily not related to activity patterns. (You will usually have more drainage when you are active than when you are resting.)  Drainage has a bad odor.  About my Jackson-Pratt Bulb Drain  What is a Jackson-Pratt bulb? A Jackson-Pratt is a soft, round device used to collect drainage. It is connected to a long, thin drainage catheter, which is held in place by one or two small stiches near your surgical incision site. When the bulb is squeezed, it forms a vacuum, forcing the drainage to empty into the bulb.  Emptying the Jackson-Pratt bulb- To empty the bulb: 1. Release the plug on the top of the bulb. 2. Pour the bulb's contents into a measuring container which your nurse will provide. 3. Record the time emptied and amount of drainage. Empty the drain(s) as often as your     doctor or nurse recommends.  Date                  Time  Amount (Drain 1)                 Amount (Drain 2)  _____________________________________________________________________  _____________________________________________________________________  _____________________________________________________________________  _____________________________________________________________________  _____________________________________________________________________  _____________________________________________________________________  _____________________________________________________________________  _____________________________________________________________________  Squeezing the Jackson-Pratt Bulb- To squeeze the bulb: 1. Make sure the plug at the top of the bulb is open. 2. Squeeze the bulb tightly in your fist. You will hear air squeezing from the bulb. 3. Replace the plug while the bulb is  squeezed. 4. Use a safety pin to attach the bulb to your clothing. This will keep the catheter from     pulling at the bulb insertion site.  When to call your doctor- Call your doctor if:  Drain site becomes red, swollen or hot.  You have a fever greater than 101 degrees F.  There is oozing at the drain site.  Drain falls out (apply a guaze bandage over the drain hole and secure it with tape).  Drainage increases daily not related to activity patterns. (You will usually have more drainage when you are active than when you are resting.)  Drainage has a bad odor.  About my Jackson-Pratt Bulb Drain  What is a Jackson-Pratt bulb? A Jackson-Pratt is a soft, round device used to collect drainage. It is connected to a long, thin drainage catheter, which is held in place by one or two small stiches near your surgical incision site. When the bulb is squeezed, it forms a vacuum, forcing the drainage to empty into the bulb.  Emptying the Jackson-Pratt bulb- To empty the bulb: 1. Release the plug on the top of the bulb. 2. Pour the bulb's contents into a measuring container which your nurse will provide. 3. Record the time emptied and amount of drainage. Empty the drain(s) as often as your     doctor or nurse recommends.  Date                  Time                    Amount (Drain 1)                 Amount (Drain  2)  _____________________________________________________________________  _____________________________________________________________________  _____________________________________________________________________  _____________________________________________________________________  _____________________________________________________________________  _____________________________________________________________________  _____________________________________________________________________  _____________________________________________________________________  Squeezing the Jackson-Pratt Bulb- To squeeze the bulb: 1. Make sure the plug at the top of the bulb is open. 2. Squeeze the bulb tightly in your fist. You will hear air squeezing from the bulb. 3. Replace the plug while the bulb is squeezed. 4. Use a safety pin to attach the bulb to your clothing. This will keep the catheter from     pulling at the bulb insertion site.  When to call your doctor- Call your doctor if:  Drain site becomes red, swollen or hot.  You have a fever greater than 101 degrees F.  There is oozing at the drain site.  Drain falls out (apply a guaze bandage over the drain hole and secure it with tape).  Drainage increases daily not related to activity patterns. (You will usually have more drainage when you are active than when you are resting.)  Drainage has a bad odor.        JP Drain [employer] this sheet to all of your post-operative appointments while you have your drains.  Please measure your drains by CC's or ML's.  Make sure you drain and measure your JP Drains 2  or 3 times per day.  At the end of each day, add up totals for the left side and add up totals for the right side.    ( 9 am )     ( 3 pm )        ( 9 pm )                Date L  R  L  R  L  R  Total L/R

## 2017-12-23 NOTE — Anesthesia Postprocedure Evaluation (Signed)
Anesthesia Post Note  Patient: Samuel Byrd  Procedure(s) Performed: RIGHT SUBCUTANEOUS MASTECTOMY GYNECOMASTIA (Right Breast)     Patient location during evaluation: PACU Anesthesia Type: General Level of consciousness: awake and alert Pain management: pain level controlled Vital Signs Assessment: post-procedure vital signs reviewed and stable Respiratory status: spontaneous breathing, nonlabored ventilation and respiratory function stable Cardiovascular status: blood pressure returned to baseline and stable Postop Assessment: no apparent nausea or vomiting Anesthetic complications: no    Last Vitals:  Vitals:   12/23/17 1100 12/23/17 1115  BP: 112/75 117/82  Pulse: 65 (!) 59  Resp: 12 11  Temp:    SpO2: 100% 100%    Last Pain:  Vitals:   12/23/17 1130  TempSrc:   PainSc: 0-No pain                 Lucretia Kern

## 2017-12-23 NOTE — Transfer of Care (Signed)
Immediate Anesthesia Transfer of Care Note  Patient: Samuel Byrd  Procedure(s) Performed: RIGHT SUBCUTANEOUS MASTECTOMY GYNECOMASTIA (Right Breast)  Patient Location: PACU  Anesthesia Type:General  Level of Consciousness: awake, alert  and oriented  Airway & Oxygen Therapy: Patient Spontanous Breathing and Patient connected to face mask oxygen  Post-op Assessment: Report given to RN and Post -op Vital signs reviewed and stable  Post vital signs: Reviewed and stable  Last Vitals:  Vitals Value Taken Time  BP    Temp    Pulse    Resp    SpO2      Last Pain:  Vitals:   12/23/17 0802  TempSrc: Oral  PainSc: 0-No pain      Patients Stated Pain Goal: 0 (12/23/17 0802)  Complications: No apparent anesthesia complications

## 2017-12-23 NOTE — Addendum Note (Signed)
Addendum  created 12/23/17 1421 by Shanon Payor, CRNA   Intraprocedure Meds edited

## 2017-12-23 NOTE — Addendum Note (Signed)
Addendum  created 12/23/17 1356 by Shanon Payor, CRNA   Intraprocedure Flowsheets edited

## 2017-12-24 ENCOUNTER — Encounter (HOSPITAL_BASED_OUTPATIENT_CLINIC_OR_DEPARTMENT_OTHER): Payer: Self-pay | Admitting: Surgery

## 2018-11-29 ENCOUNTER — Ambulatory Visit (INDEPENDENT_AMBULATORY_CARE_PROVIDER_SITE_OTHER): Payer: Managed Care, Other (non HMO) | Admitting: Emergency Medicine

## 2018-11-29 ENCOUNTER — Other Ambulatory Visit: Payer: Self-pay

## 2018-11-29 ENCOUNTER — Encounter: Payer: Self-pay | Admitting: Emergency Medicine

## 2018-11-29 VITALS — BP 117/61 | HR 79 | Temp 98.5°F | Resp 16 | Ht 66.75 in | Wt 136.4 lb

## 2018-11-29 DIAGNOSIS — K279 Peptic ulcer, site unspecified, unspecified as acute or chronic, without hemorrhage or perforation: Secondary | ICD-10-CM

## 2018-11-29 DIAGNOSIS — Z8711 Personal history of peptic ulcer disease: Secondary | ICD-10-CM

## 2018-11-29 DIAGNOSIS — R1013 Epigastric pain: Secondary | ICD-10-CM

## 2018-11-29 MED ORDER — PANTOPRAZOLE SODIUM 40 MG PO TBEC: 40.0000 mg | DELAYED_RELEASE_TABLET | Freq: Every day | ORAL | 3 refills | Status: DC

## 2018-11-29 NOTE — Progress Notes (Signed)
Samuel Byrd 33 y.o.   Chief Complaint  Patient presents with  . Abdominal Pain    per patient x 1 week - had stomach ulcer before    HISTORY OF PRESENT ILLNESS: This is a 33 y.o. male complaining of epigastric discomfort for the past week.  Has a history of peptic ulcer disease with complication of an upper GI bleed.  Used to be on Protonix.  At present time denies nausea or vomiting, rectal bleeding or melena.  Denies feeling lightheaded or syncopal episode.  HPI   Prior to Admission medications   Medication Sig Start Date End Date Taking? Authorizing Provider  oxyCODONE (OXY IR/ROXICODONE) 5 MG immediate release tablet Take 1 tablet (5 mg total) by mouth every 6 (six) hours as needed for severe pain. Patient not taking: Reported on 11/29/2018 12/23/17   Samuel Byrd    No Known Allergies  Patient Active Problem List   Diagnosis Date Noted  . Gynecomastia 04/28/2017  . PUD (peptic ulcer disease) 10/09/2016    Past Medical History:  Diagnosis Date  . Gynecomastia 12/2017   right    Past Surgical History:  Procedure Laterality Date  . ESOPHAGOGASTRODUODENOSCOPY N/A 07/23/2015   Procedure: ESOPHAGOGASTRODUODENOSCOPY (EGD);  Surgeon: Sherrilyn RistHenry L Danis III, Byrd;  Location: [facility];  Service: Endoscopy;  Laterality: N/A;  . GYNECOMASTIA MASTECTOMY Right 12/23/2017   Procedure: RIGHT SUBCUTANEOUS MASTECTOMY GYNECOMASTIA;  Surgeon: Samuel Byrd;  Location: [facility];  Service: General;  Laterality: Right;    Social History   Socioeconomic History  . Marital status: Married    Spouse name: Not on file  . Number of children: 0  . Years of education: Not on file  . Highest education level: Not on file  Occupational History  . Occupation: [employer]  Social Needs  . Financial resource strain: Not on file  . Food insecurity    Worry: Not on file    Inability: Not on file  . Transportation needs    Medical: Not on file   Non-medical: Not on file  Tobacco Use  . Smoking status: Former Smoker    Quit date: 01/07/2017    Years since quitting: 1.8  . Smokeless tobacco: Never Used  Substance and Sexual Activity  . Alcohol use: Yes    Comment: occasionally  . Drug use: No  . Sexual activity: Not on file  Lifestyle  . Physical activity    Days per week: Not on file    Minutes per session: Not on file  . Stress: Not on file  Relationships  . Social Musicianconnections    Talks on phone: Not on file    Gets together: Not on file    Attends religious service: Not on file    Active member of club or organization: Not on file    Attends meetings of clubs or organizations: Not on file    Relationship status: Not on file  . Intimate partner violence    Fear of current or ex partner: Not on file    Emotionally abused: Not on file    Physically abused: Not on file    Forced sexual activity: Not on file  Other Topics Concern  . Not on file  Social History Narrative  . Not on file    Family History  Problem Relation Age of Onset  . Heart attack Mother   . Ulcers Brother      Review of Systems  Constitutional: Negative.  Negative for chills  and fever.  HENT: Negative.  Negative for congestion and sore throat.   Eyes: Negative.   Respiratory: Negative.  Negative for cough and shortness of breath.   Cardiovascular: Negative.  Negative for chest pain and palpitations.  Gastrointestinal: Positive for abdominal pain. Negative for blood in stool, diarrhea, melena, nausea and vomiting.  Genitourinary: Negative.   Musculoskeletal: Negative.   Skin: Negative.  Negative for rash.  Neurological: Negative.  Negative for dizziness and headaches.  Endo/Heme/Allergies: Negative.   All other systems reviewed and are negative.  Vitals:   11/29/18 1129  BP: 117/61  Pulse: 79  Resp: 16  Temp: 98.5 F (36.9 C)  SpO2: 97%     Physical Exam Vitals signs reviewed.  Constitutional:      Appearance: He is  well-developed.  HENT:     Head: Normocephalic.     Mouth/Throat:     Mouth: Mucous membranes are moist.     Pharynx: Oropharynx is clear.  Eyes:     Extraocular Movements: Extraocular movements intact.     Conjunctiva/sclera: Conjunctivae normal.     Pupils: Pupils are equal, round, and reactive to light.  Neck:     Musculoskeletal: Normal range of motion and neck supple.  Cardiovascular:     Rate and Rhythm: Normal rate and regular rhythm.     Pulses: Normal pulses.     Heart sounds: Normal heart sounds.  Pulmonary:     Effort: Pulmonary effort is normal.     Breath sounds: Normal breath sounds.  Abdominal:     General: Bowel sounds are normal. There is no distension.     Palpations: Abdomen is soft. There is no mass.     Tenderness: There is no abdominal tenderness. There is no guarding or rebound.  Musculoskeletal: Normal range of motion.  Skin:    General: Skin is warm and dry.     Capillary Refill: Capillary refill takes less than 2 seconds.  Neurological:     General: No focal deficit present.     Mental Status: He is alert and oriented to person, place, and time.  Psychiatric:        Mood and Affect: Mood normal.        Behavior: Behavior normal.     A total of 25 minutes was spent in the room with the patient, greater than 50% of which was in counseling/coordination of care regarding differential diagnosis including peptic ulcer disease, treatment, diet and nutrition, medication side effects, complications  with these possible diagnoses, prognosis, and need for follow-up.   ASSESSMENT & PLAN: PUD (peptic ulcer disease) Active.  No clinical signs of complications.  Will start pantoprazole 40 mg daily.  If no better, will refer to GI for upper endoscopy.  Diet appropriate for peptic ulcers discussed with patient.  Follow-up in 3 months, earlier as needed.  Samuel Byrd was seen today for abdominal pain.  Diagnoses and all orders for this visit:  Epigastric discomfort  -     CBC with Differential/Platelet -     Comprehensive metabolic panel -     pantoprazole (PROTONIX) 40 MG tablet; Take 1 tablet (40 mg total) by mouth daily.  History of peptic ulcer disease -     pantoprazole (PROTONIX) 40 MG tablet; Take 1 tablet (40 mg total) by mouth daily.  PUD (peptic ulcer disease)    Patient Instructions       If you have lab work done today you will be contacted with your lab results within  the next 2 weeks.  If you have not heard from [place] then please contact us. The fastest way to get your results is to register for My Chart.   IF you received an x-ray today, you will receive an invoice from [facility]. Please contact [facility] at [phone] with questions or concerns regarding your invoice.   IF you received labwork today, you will receive an invoice from [place]. Please contact [facility] at [phone] with questions or concerns regarding your invoice.   Our billing staff will not be able to assist you with questions regarding bills from these companies.  You will be contacted with the lab results as soon as they are available. The fastest way to get your results is to activate your My Chart account. Instructions are located on the last page of this paperwork. If you have not heard from [place] regarding the results in 2 weeks, please contact this office.     Peptic Ulcer  A peptic ulcer is a painful sore in the lining of your stomach or the first part of your small intestine. What are the causes? Common causes of this condition include:  An infection.  Using certain pain medicines too often or too much. What increases the risk? You are more likely to get this condition if you:  Smoke.  Have a family history of ulcer disease.  Drink alcohol.  Have been hospitalized in an intensive care unit (ICU). What are the signs or symptoms? Symptoms include:  Burning pain in the area between the chest and the belly button. The  pain may: ? Not go away (be persistent). ? Be worse when your stomach is empty. ? Be worse at night.  Heartburn.  Feeling sick to your stomach (nauseous) and throwing up (vomiting).  Bloating. If the ulcer results in bleeding, it can cause you to:  Have poop (stool) that is black and looks like tar.  Throw up bright red blood.  Throw up material that looks like coffee grounds. How is this treated? Treatment for this condition may include:  Stopping things that can cause the ulcer, such as: ? Smoking. ? Using pain medicines.  Medicines to reduce stomach acid.  Antibiotic medicines if the ulcer is caused by an infection.  A procedure that is done using a small, flexible tube that has a camera at the end (upper endoscopy). This may be done if you have a bleeding ulcer.  Surgery. This may be needed if: ? You have a lot of bleeding. ? The ulcer caused a hole somewhere in the digestive system. Follow these instructions at home:  Do not drink alcohol if your doctor tells you not to drink.  Limit how much caffeine you take in.  Do not use any products that contain nicotine or tobacco, such as cigarettes, e-cigarettes, and chewing tobacco. If you need help quitting, ask your doctor.  Take over-the-counter and prescription medicines only as told by your doctor. ? Do not stop or change your medicines unless you talk with your doctor about it first. ? Do not take aspirin, ibuprofen, or other NSAIDs unless your doctor told you to do so.  Keep all follow-up visits as told by your doctor. This is important. Contact a doctor if:  You do not get better in 7 days after you start treatment.  You keep having an upset stomach (indigestion) or heartburn. Get help right away if:  You have sudden, sharp pain in your belly (abdomen).  You have belly pain that  does not go away.  You have bloody poop (stool) or black, tarry poop.  You throw up blood. It may look like coffee grounds.   You feel light-headed or feel like you may pass out (faint).  You get weak.  You get sweaty or feel sticky and cold to the touch (clammy). Summary  Symptoms of a peptic ulcer include burning pain in the area between the chest and the belly button.  Take medicines only as told by your doctor.  Limit how much alcohol and caffeine you have.  Keep all follow-up visits as told by your doctor. This information is not intended to replace advice given to you by your health care provider. Make sure you discuss any questions you have with your health care provider. Document Released: 05/21/2009 Document Revised: 09/01/2017 Document Reviewed: 09/01/2017 Elsevier Patient Education  2020 Elsevier Inc.      Edwina BarthMiguel Aloise Copus, [facility]

## 2018-11-29 NOTE — Assessment & Plan Note (Signed)
Active.  No clinical signs of complications.  Will start pantoprazole 40 mg daily.  If no better, will refer to GI for upper endoscopy.  Diet appropriate for peptic ulcers discussed with patient.  Follow-up in 3 months, earlier as needed.

## 2018-11-29 NOTE — Patient Instructions (Addendum)
If you have lab work done today you will be contacted with your lab results within the next 2 weeks.  If you have not heard from [place] then please contact us. The fastest way to get your results is to register for My Chart.   IF you received an x-ray today, you will receive an invoice from [facility]. Please contact [facility] at [phone] with questions or concerns regarding your invoice.   IF you received labwork today, you will receive an invoice from [facility]. Please contact [facility] at [phone] with questions or concerns regarding your invoice.   Our billing staff will not be able to assist you with questions regarding bills from these companies.  You will be contacted with the lab results as soon as they are available. The fastest way to get your results is to activate your My Chart account. Instructions are located on the last page of this paperwork. If you have not heard from [place] regarding the results in 2 weeks, please contact this office.     Peptic Ulcer  A peptic ulcer is a painful sore in the lining of your stomach or the first part of your small intestine. What are the causes? Common causes of this condition include:  An infection.  Using certain pain medicines too often or too much. What increases the risk? You are more likely to get this condition if you:  Smoke.  Have a family history of ulcer disease.  Drink alcohol.  Have been hospitalized in an intensive care unit (ICU). What are the signs or symptoms? Symptoms include:  Burning pain in the area between the chest and the belly button. The pain may: ? Not go away (be persistent). ? Be worse when your stomach is empty. ? Be worse at night.  Heartburn.  Feeling sick to your stomach (nauseous) and throwing up (vomiting).  Bloating. If the ulcer results in bleeding, it can cause you to:  Have poop (stool) that is black and looks like tar.  Throw up bright red  blood.  Throw up material that looks like coffee grounds. How is this treated? Treatment for this condition may include:  Stopping things that can cause the ulcer, such as: ? Smoking. ? Using pain medicines.  Medicines to reduce stomach acid.  Antibiotic medicines if the ulcer is caused by an infection.  A procedure that is done using a small, flexible tube that has a camera at the end (upper endoscopy). This may be done if you have a bleeding ulcer.  Surgery. This may be needed if: ? You have a lot of bleeding. ? The ulcer caused a hole somewhere in the digestive system. Follow these instructions at home:  Do not drink alcohol if your doctor tells you not to drink.  Limit how much caffeine you take in.  Do not use any products that contain nicotine or tobacco, such as cigarettes, e-cigarettes, and chewing tobacco. If you need help quitting, ask your doctor.  Take over-the-counter and prescription medicines only as told by your doctor. ? Do not stop or change your medicines unless you talk with your doctor about it first. ? Do not take aspirin, ibuprofen, or other NSAIDs unless your doctor told you to do so.  Keep all follow-up visits as told by your doctor. This is important. Contact a doctor if:  You do not get better in 7 days after you start treatment.  You keep having an upset stomach (indigestion) or heartburn. Get help  right away if:  You have sudden, sharp pain in your belly (abdomen).  You have belly pain that does not go away.  You have bloody poop (stool) or black, tarry poop.  You throw up blood. It may look like coffee grounds.  You feel light-headed or feel like you may pass out (faint).  You get weak.  You get sweaty or feel sticky and cold to the touch (clammy). Summary  Symptoms of a peptic ulcer include burning pain in the area between the chest and the belly button.  Take medicines only as told by your doctor.  Limit how much alcohol and  caffeine you have.  Keep all follow-up visits as told by your doctor. This information is not intended to replace advice given to you by your health care provider. Make sure you discuss any questions you have with your health care provider. Document Released: 05/21/2009 Document Revised: 09/01/2017 Document Reviewed: 09/01/2017 Elsevier Patient Education  2020 [employer].

## 2018-11-30 ENCOUNTER — Encounter: Payer: Self-pay | Admitting: Emergency Medicine

## 2018-11-30 LAB — CBC WITH DIFFERENTIAL/PLATELET
Basophils Absolute: 0 10*3/uL (ref 0.0–0.2)
Basos: 0 %
EOS (ABSOLUTE): 0 10*3/uL (ref 0.0–0.4)
Eos: 1 %
Hematocrit: 46.2 % (ref 37.5–51.0)
Hemoglobin: 14.8 g/dL (ref 13.0–17.7)
Immature Grans (Abs): 0 10*3/uL (ref 0.0–0.1)
Immature Granulocytes: 0 %
Lymphocytes Absolute: 2 10*3/uL (ref 0.7–3.1)
Lymphs: 38 %
MCH: 26.1 pg — ABNORMAL LOW (ref 26.6–33.0)
MCHC: 32 g/dL (ref 31.5–35.7)
MCV: 82 fL (ref 79–97)
Monocytes Absolute: 0.6 10*3/uL (ref 0.1–0.9)
Monocytes: 12 %
Neutrophils Absolute: 2.6 10*3/uL (ref 1.4–7.0)
Neutrophils: 49 %
Platelets: 207 10*3/uL (ref 150–450)
RBC: 5.66 x10E6/uL (ref 4.14–5.80)
RDW: 12.7 % (ref 11.6–15.4)
WBC: 5.4 10*3/uL (ref 3.4–10.8)

## 2018-11-30 LAB — COMPREHENSIVE METABOLIC PANEL
ALT: 17 IU/L (ref 0–44)
AST: 26 IU/L (ref 0–40)
Albumin/Globulin Ratio: 1.4 (ref 1.2–2.2)
Albumin: 4.6 g/dL (ref 4.0–5.0)
Alkaline Phosphatase: 51 IU/L (ref 39–117)
BUN/Creatinine Ratio: 18 (ref 9–20)
BUN: 19 mg/dL (ref 6–20)
Bilirubin Total: 1.4 mg/dL — ABNORMAL HIGH (ref 0.0–1.2)
CO2: 24 mmol/L (ref 20–29)
Calcium: 9.5 mg/dL (ref 8.7–10.2)
Chloride: 98 mmol/L (ref 96–106)
Creatinine, Ser: 1.06 mg/dL (ref 0.76–1.27)
GFR calc Af Amer: 105 mL/min/{1.73_m2} (ref >59)
GFR calc non Af Amer: 90 mL/min/{1.73_m2} (ref >59)
Globulin, Total: 3.2 g/dL (ref 1.5–4.5)
Glucose: 80 mg/dL (ref 65–99)
Potassium: 3.7 mmol/L (ref 3.5–5.2)
Sodium: 138 mmol/L (ref 134–144)
Total Protein: 7.8 g/dL (ref 6.0–8.5)

## 2019-01-20 ENCOUNTER — Other Ambulatory Visit: Payer: Self-pay | Admitting: Emergency Medicine

## 2019-01-20 DIAGNOSIS — R1013 Epigastric pain: Secondary | ICD-10-CM

## 2019-01-20 DIAGNOSIS — Z8711 Personal history of peptic ulcer disease: Secondary | ICD-10-CM

## 2019-01-20 NOTE — Telephone Encounter (Signed)
Forwarding medication refill request to the clinical pool for review. 

## 2019-01-24 ENCOUNTER — Other Ambulatory Visit: Payer: Self-pay | Admitting: *Deleted

## 2019-02-11 ENCOUNTER — Other Ambulatory Visit: Payer: Self-pay

## 2019-02-11 ENCOUNTER — Ambulatory Visit (INDEPENDENT_AMBULATORY_CARE_PROVIDER_SITE_OTHER): Payer: Managed Care, Other (non HMO) | Admitting: Registered Nurse

## 2019-02-11 ENCOUNTER — Ambulatory Visit (INDEPENDENT_AMBULATORY_CARE_PROVIDER_SITE_OTHER): Payer: Managed Care, Other (non HMO)

## 2019-02-11 ENCOUNTER — Encounter: Payer: Self-pay | Admitting: Registered Nurse

## 2019-02-11 VITALS — BP 120/73 | HR 92 | Temp 98.7°F | Wt 134.6 lb

## 2019-02-11 DIAGNOSIS — M25562 Pain in left knee: Secondary | ICD-10-CM | POA: Diagnosis not present

## 2019-02-11 MED ORDER — MELOXICAM 7.5 MG PO TABS: 7.5000 mg | ORAL_TABLET | Freq: Every day | ORAL | 0 refills | Status: DC

## 2019-02-11 MED ORDER — CYCLOBENZAPRINE HCL 5 MG PO TABS: 5.0000 mg | ORAL_TABLET | Freq: Three times a day (TID) | ORAL | 1 refills | Status: DC | PRN

## 2019-02-11 NOTE — Patient Instructions (Signed)
° ° ° °  If you have lab work done today you will be contacted with your lab results within the next 2 weeks.  If you have not heard from us then please contact us. The fastest way to get your results is to register for My Chart. ° ° °IF you received an x-ray today, you will receive an invoice from [facility]. Please contact [facility] at [phone] with questions or concerns regarding your invoice.  ° °IF you received labwork today, you will receive an invoice from [facility]. Please contact [facility] at [phone] with questions or concerns regarding your invoice.  ° °Our billing staff will not be able to assist you with questions regarding bills from these companies. ° °You will be contacted with the lab results as soon as they are available. The fastest way to get your results is to activate your My Chart account. Instructions are located on the last page of this paperwork. If you have not heard from us regarding the results in 2 weeks, please contact this office. °  ° ° ° °

## 2019-02-12 NOTE — Progress Notes (Signed)
Acute Office Visit  Subjective:    Patient ID: Samuel Byrd, male    DOB: 04/28/1985, 33 y.o.   MRN: [id number]  Chief Complaint  Patient presents with  . Knee Pain    left knee pain for 2 days now. Patient stated he was stepping out of his truck and possible stepped wrong and knee been hurting ever since    HPI Patient is in today for L knee injury Stepping out of truck at work and landed wrong - immediate pain, hurting since, having trouble bearing weight on it since. Notes swelling. Denies change in sensation in LLE. Denies symptoms of blood clot. No gross abnormality in knee.  Has not injured this extremity before.   Past Medical History:  Diagnosis Date  . Gynecomastia 12/2017   right    Past Surgical History:  Procedure Laterality Date  . ESOPHAGOGASTRODUODENOSCOPY N/A 07/23/2015   Procedure: ESOPHAGOGASTRODUODENOSCOPY (EGD);  Surgeon: Sherrilyn Rist, MD;  Location: [facility];  Service: Endoscopy;  Laterality: N/A;  . GYNECOMASTIA MASTECTOMY Right 12/23/2017   Procedure: RIGHT SUBCUTANEOUS MASTECTOMY GYNECOMASTIA;  Surgeon: Manus Rudd, MD;  Location: [facility];  Service: General;  Laterality: Right;    Family History  Problem Relation Age of Onset  . Heart attack Mother   . Ulcers Brother     Social History   Socioeconomic History  . Marital status: Married    Spouse name: Not on file  . Number of children: 0  . Years of education: Not on file  . Highest education level: Not on file  Occupational History  . Occupation: [employer]  Social Needs  . Financial resource strain: Not on file  . Food insecurity    Worry: Not on file    Inability: Not on file  . Transportation needs    Medical: Not on file    Non-medical: Not on file  Tobacco Use  . Smoking status: Former Smoker    Quit date: 01/07/2017    Years since quitting: 2.0  . Smokeless tobacco: Never Used  Substance and Sexual Activity  . Alcohol use: Yes   Comment: occasionally  . Drug use: No  . Sexual activity: Not on file  Lifestyle  . Physical activity    Days per week: Not on file    Minutes per session: Not on file  . Stress: Not on file  Relationships  . Social Musician on phone: Not on file    Gets together: Not on file    Attends religious service: Not on file    Active member of club or organization: Not on file    Attends meetings of clubs or organizations: Not on file    Relationship status: Not on file  . Intimate partner violence    Fear of current or ex partner: Not on file    Emotionally abused: Not on file    Physically abused: Not on file    Forced sexual activity: Not on file  Other Topics Concern  . Not on file  Social History Narrative  . Not on file    Outpatient Medications Prior to Visit  Medication Sig Dispense Refill  . oxyCODONE (OXY IR/ROXICODONE) 5 MG immediate release tablet Take 1 tablet (5 mg total) by mouth every 6 (six) hours as needed for severe pain. (Patient not taking: Reported on 11/29/2018) 20 tablet 0  . pantoprazole (PROTONIX) 40 MG tablet TAKE 1 TABLET BY MOUTH EVERY DAY 90 tablet 0  No facility-administered medications prior to visit.     No Known Allergies  Review of Systems  Constitutional: Negative.   HENT: Negative.   Eyes: Negative.   Respiratory: Negative.   Cardiovascular: Negative.   Gastrointestinal: Negative.   Genitourinary: Negative.   Musculoskeletal: Positive for joint pain (L knee). Negative for back pain and myalgias.  Skin: Negative.   Neurological: Negative.   Endo/Heme/Allergies: Negative.   Psychiatric/Behavioral: Negative.   All other systems reviewed and are negative.      Objective:    Physical Exam  Constitutional: He is oriented to person, place, and time. He appears well-developed and well-nourished. No distress.  Cardiovascular: Normal rate and regular rhythm.  Pulmonary/Chest: Effort normal. No respiratory distress.   Musculoskeletal:        General: Tenderness (joint line of L knee) and edema (L knee, largely on anterior/medial ) present. No deformity.     Comments: Limited ROM in L knee in flexion and extension. Negative anterior and posterior drawer test. No quadriceps or patellar tendon tenderness.  No swelling in calf, ankle, or foot. Pulses intact on L extremity. Cap refill rapid. Sensation intact  Neurological: He is oriented to person, place, and time.  Skin: Skin is warm and dry. No rash noted. He is not diaphoretic. No erythema. No pallor.  Psychiatric: He has a normal mood and affect. His behavior is normal. Judgment and thought content normal.    BP 120/73 (BP Location: Right Arm, Patient Position: Sitting, Cuff Size: Normal)   Pulse 92   Temp 98.7 F (37.1 C) (Temporal)   Wt 134 lb 9.6 oz (61.1 kg)   SpO2 98%   BMI 21.24 kg/m  Wt Readings from Last 3 Encounters:  02/11/19 134 lb 9.6 oz (61.1 kg)  11/29/18 136 lb 6.4 oz (61.9 kg)  12/23/17 130 lb 8.2 oz (59.2 kg)    There are no preventive care reminders to display for this patient.  There are no preventive care reminders to display for this patient.   No results found for: TSH Lab Results  Component Value Date   WBC 5.4 11/29/2018   HGB 14.8 11/29/2018   HCT 46.2 11/29/2018   MCV 82 11/29/2018   PLT 207 11/29/2018   Lab Results  Component Value Date   NA 138 11/29/2018   K 3.7 11/29/2018   CO2 24 11/29/2018   GLUCOSE 80 11/29/2018   BUN 19 11/29/2018   CREATININE 1.06 11/29/2018   BILITOT 1.4 (H) 11/29/2018   ALKPHOS 51 11/29/2018   AST 26 11/29/2018   ALT 17 11/29/2018   PROT 7.8 11/29/2018   ALBUMIN 4.6 11/29/2018   CALCIUM 9.5 11/29/2018   ANIONGAP 5 07/24/2015   No results found for: CHOL No results found for: HDL No results found for: LDLCALC No results found for: TRIG No results found for: CHOLHDL No results found for: [id number]     Assessment & Plan:   Problem List Items Addressed This Visit     None    Visit Diagnoses    Left knee pain, unspecified chronicity    -  Primary   Relevant Medications   cyclobenzaprine (FLEXERIL) 5 MG tablet   meloxicam (MOBIC) 7.5 MG tablet   Other Relevant Orders   DG Knee Complete 4 Views Left (Completed)   Ambulatory referral to Orthopedic Surgery       Meds ordered this encounter  Medications  . cyclobenzaprine (FLEXERIL) 5 MG tablet    Sig: Take 1 tablet (5 mg  total) by mouth 3 (three) times daily as needed for muscle spasms.    Dispense:  30 tablet    Refill:  1  . meloxicam (MOBIC) 7.5 MG tablet    Sig: Take 1 tablet (7.5 mg total) by mouth daily.    Dispense:  30 tablet    Refill:  0   PLAN  Xray reveals effusion with lateral patellar drift - suggestive of potential soft tissue injury. Due to effusion and pain, difficult to elicit exact injury at today's visit.  Referral to ortho for further investigation of this injury  Encouraged patient to RICE, use meloxicam and flexeril, stay home from work.   Patient encouraged to call clinic with any questions, comments, or concerns.   Maximiano Coss, NP

## 2019-02-22 ENCOUNTER — Other Ambulatory Visit: Payer: Self-pay

## 2019-02-22 ENCOUNTER — Ambulatory Visit (INDEPENDENT_AMBULATORY_CARE_PROVIDER_SITE_OTHER): Payer: Managed Care, Other (non HMO) | Admitting: Orthopaedic Surgery

## 2019-02-22 DIAGNOSIS — M25562 Pain in left knee: Secondary | ICD-10-CM | POA: Diagnosis not present

## 2019-02-22 DIAGNOSIS — G8929 Other chronic pain: Secondary | ICD-10-CM

## 2019-02-22 NOTE — Progress Notes (Addendum)
Office Visit Note   Patient: Samuel Byrd           Date of Birth: 1985-09-15           MRN: [id number] Visit Date: 02/22/2019              Requested by: Maximiano Coss, NP [place],  [place] [address] PCP: Horald Pollen, MD   Assessment & Plan: Visit Diagnoses:  1. Acute pain of left knee     Plan: My impression is acute left medial meniscal tear with symptomatic effusion and mechanical symptoms.  We will need to obtain an MRI to evaluate for the extent of the tear.  We will see him back in the near future to review the MRI.  Follow-Up Instructions: Return in about 10 days (around 03/04/2019) for review left knee MRI.   Orders:  No orders of the defined types were placed in this encounter.  No orders of the defined types were placed in this encounter.     Procedures: No procedures performed   Clinical Data: No additional findings.   Subjective: Chief Complaint  Patient presents with  . Left Knee - Pain    Samuel Byrd is a very pleasant 33 year old gentleman comes in for acute left knee injury that occurred last week when he was stepping out of a car and felt immediate pain and tearing sensation on the medial side of his knee.  He develops swelling subsequently and now he has mechanical symptoms he feels like something is stuck inside his knee.  He is unable to flex his knee fully.  He does have a symptomatic effusion.   Review of Systems  Constitutional: Negative.   All other systems reviewed and are negative.    Objective: Vital Signs: There were no vitals taken for this visit.  Physical Exam Vitals and nursing note reviewed.  Constitutional:      Appearance: He is well-developed.  HENT:     Head: Normocephalic and atraumatic.  Eyes:     Pupils: Pupils are equal, round, and reactive to light.  Pulmonary:     Effort: Pulmonary effort is normal.  Abdominal:     Palpations: Abdomen is soft.  Musculoskeletal:        General: Normal range  of motion.     Cervical back: Neck supple.  Skin:    General: Skin is warm.  Neurological:     Mental Status: He is alert and oriented to person, place, and time.  Psychiatric:        Behavior: Behavior normal.        Thought Content: Thought content normal.        Judgment: Judgment normal.     Ortho Exam Left knee exam shows a moderate joint effusion.  Collaterals cruciates are stable.  Significant medial joint line tenderness.  Pain at the medial joint line with McMurray testing.  Positive Thessaly. Specialty Comments:  No specialty comments available.  Imaging: No results found.   PMFS History: Patient Active Problem List   Diagnosis Date Noted  . Epigastric discomfort 11/29/2018  . History of peptic ulcer disease 11/29/2018  . Gynecomastia 04/28/2017  . PUD (peptic ulcer disease) 10/09/2016   Past Medical History:  Diagnosis Date  . Gynecomastia 12/2017   right    Family History  Problem Relation Age of Onset  . Heart attack Mother   . Ulcers Brother     Past Surgical History:  Procedure Laterality Date  . ESOPHAGOGASTRODUODENOSCOPY N/A  07/23/2015   Procedure: ESOPHAGOGASTRODUODENOSCOPY (EGD);  Surgeon: Sherrilyn Rist, MD;  Location: [facility];  Service: Endoscopy;  Laterality: N/A;  . GYNECOMASTIA MASTECTOMY Right 12/23/2017   Procedure: RIGHT SUBCUTANEOUS MASTECTOMY GYNECOMASTIA;  Surgeon: Manus Rudd, MD;  Location: [facility];  Service: General;  Laterality: Right;   Social History   Occupational History  . Occupation: [employer]  Tobacco Use  . Smoking status: Former Smoker    Quit date: 01/07/2017    Years since quitting: 2.1  . Smokeless tobacco: Never Used  Substance and Sexual Activity  . Alcohol use: Yes    Comment: occasionally  . Drug use: No  . Sexual activity: Not on file

## 2019-03-10 ENCOUNTER — Other Ambulatory Visit: Payer: Self-pay | Admitting: Registered Nurse

## 2019-03-10 DIAGNOSIS — M25562 Pain in left knee: Secondary | ICD-10-CM

## 2019-03-12 ENCOUNTER — Other Ambulatory Visit: Payer: Managed Care, Other (non HMO)

## 2019-03-15 ENCOUNTER — Ambulatory Visit: Payer: Managed Care, Other (non HMO) | Admitting: Orthopaedic Surgery

## 2019-03-16 ENCOUNTER — Encounter: Payer: Self-pay | Admitting: Orthopaedic Surgery

## 2019-03-22 ENCOUNTER — Encounter: Payer: Self-pay | Admitting: Orthopaedic Surgery

## 2019-03-22 ENCOUNTER — Ambulatory Visit (INDEPENDENT_AMBULATORY_CARE_PROVIDER_SITE_OTHER): Payer: Managed Care, Other (non HMO) | Admitting: Orthopaedic Surgery

## 2019-03-22 ENCOUNTER — Other Ambulatory Visit: Payer: Self-pay

## 2019-03-22 DIAGNOSIS — M6752 Plica syndrome, left knee: Secondary | ICD-10-CM | POA: Diagnosis not present

## 2019-03-22 DIAGNOSIS — M12262 Villonodular synovitis (pigmented), left knee: Secondary | ICD-10-CM

## 2019-03-22 NOTE — Addendum Note (Signed)
Addended by: Mayra Reel on: 03/22/2019 10:18 AM   Modules accepted: Level of Service

## 2019-03-22 NOTE — Progress Notes (Addendum)
Office Visit Note   Patient: Samuel Byrd           Date of Birth: 30-Jan-1986           MRN: [id number] Visit Date: 03/22/2019              Requested by: Georgina Quint, MD [address] [place],  [place] [address] PCP: Georgina Quint, MD   Assessment & Plan: Visit Diagnoses:  1. Pigmented villonodular synovitis of knee, left   2. Plica syndrome of left knee     Plan: MRI of the left knee shows PVNS between his ACL and PCL as well as a thickened medial plica.  Negative for meniscal pathology.  No chondromalacia.  These findings were reviewed with the patient in detail.  Based on discussion patient would like to give this more thought and evaluate how much pain he has and whether he wants to have surgery for this.  He understands that PVNS can proliferate and continue to cause recurrent effusions.  He will be in touch in the future.  Follow-up as needed.  Follow-Up Instructions: Return if symptoms worsen or fail to improve.   Orders:  No orders of the defined types were placed in this encounter.  No orders of the defined types were placed in this encounter.     Procedures: No procedures performed   Clinical Data: No additional findings.   Subjective: Chief Complaint  Patient presents with  . Left Knee - Pain    [name] returns today for MRI review.  No changes in his symptoms.   Review of Systems  Constitutional: Negative.   All other systems reviewed and are negative.    Objective: Vital Signs: There were no vitals taken for this visit.  Physical Exam Vitals and nursing note reviewed.  Constitutional:      Appearance: He is well-developed.  Pulmonary:     Effort: Pulmonary effort is normal.  Abdominal:     Palpations: Abdomen is soft.  Skin:    General: Skin is warm.  Neurological:     Mental Status: He is alert and oriented to person, place, and time.  Psychiatric:        Behavior: Behavior normal.        Thought Content: Thought content  normal.        Judgment: Judgment normal.     Ortho Exam Left knee exam shows a small joint effusion.  He has pain along the medial aspect of the knee. Specialty Comments:  No specialty comments available.  Imaging: No results found.   PMFS History: Patient Active Problem List   Diagnosis Date Noted  . 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 Epigastric discomfort 11/29/2018  . History of peptic ulcer disease 11/29/2018  . Gynecomastia 04/28/2017  . PUD (peptic ulcer disease) 10/09/2016   Past Medical History:  Diagnosis Date  . Gynecomastia 12/2017   right    Family History  Problem Relation Age of Onset  . Heart attack Mother   . Ulcers Brother     Past Surgical History:  Procedure Laterality Date  . ESOPHAGOGASTRODUODENOSCOPY N/A 07/23/2015   Procedure: ESOPHAGOGASTRODUODENOSCOPY (EGD);  Surgeon: Sherrilyn Rist, MD;  Location: [facility];  Service: Endoscopy;  Laterality: N/A;  . GYNECOMASTIA MASTECTOMY Right 12/23/2017   Procedure: RIGHT SUBCUTANEOUS MASTECTOMY GYNECOMASTIA;  Surgeon: Manus Rudd, MD;  Location: [facility];  Service: General;  Laterality: Right;  Social History   Occupational History  . Occupation: [employer]  Tobacco Use  . Smoking status: Former Smoker    Quit date: 01/07/2017    Years since quitting: 2.2  . Smokeless tobacco: Never Used  Substance and Sexual Activity  . Alcohol use: Yes    Comment: occasionally  . Drug use: No  . Sexual activity: Not on file

## 2019-04-05 ENCOUNTER — Other Ambulatory Visit: Payer: Self-pay | Admitting: Registered Nurse

## 2019-04-05 DIAGNOSIS — M25562 Pain in left knee: Secondary | ICD-10-CM

## 2019-06-04 ENCOUNTER — Ambulatory Visit: Payer: Managed Care, Other (non HMO) | Attending: Internal Medicine

## 2019-06-04 DIAGNOSIS — Z23 Encounter for immunization: Secondary | ICD-10-CM

## 2019-06-04 NOTE — Progress Notes (Signed)
   Covid-19 Vaccination Clinic  Name:  Samuel Byrd    MRN: [id number] DOB: 05-27-1985  06/04/2019  [name] was observed post Covid-19 immunization for 15 minutes without incident. He was provided with Vaccine Information Sheet and instruction to access the V-Safe system.   [name] was instructed to call 911 with any severe reactions post vaccine: Marland Kitchen Difficulty breathing  . Swelling of face and throat  . A fast heartbeat  . A bad rash all over body  . Dizziness and weakness   Immunizations Administered    Name Date Dose VIS Date Route   Pfizer COVID-19 Vaccine 06/04/2019  3:06 PM 0.3 mL 02/18/2019 Intramuscular   Manufacturer: [employer], [employer]   Lot: [id number]   NDC: [address]-3

## 2019-06-28 ENCOUNTER — Ambulatory Visit: Payer: Managed Care, Other (non HMO) | Attending: Internal Medicine

## 2019-06-28 DIAGNOSIS — Z23 Encounter for immunization: Secondary | ICD-10-CM

## 2019-06-28 NOTE — Progress Notes (Signed)
   Covid-19 Vaccination Clinic  Name:  Samuel Byrd    MRN: [id number] DOB: [birth year]  06/28/2019  [name] was observed post Covid-19 immunization for 15 minutes without incident. He was provided with Vaccine Information Sheet and instruction to access the V-Safe system.   [name] was instructed to call 911 with any severe reactions post vaccine: Marland Kitchen Difficulty breathing  . Swelling of face and throat  . A fast heartbeat  . A bad rash all over body  . Dizziness and weakness   Immunizations Administered    Name Date Dose VIS Date Route   Pfizer COVID-19 Vaccine 06/28/2019 11:23 AM 0.3 mL 05/04/2018 Intramuscular   Manufacturer: [employer], [employer]   Lot: [id number]   NDC: [address]-6

## 2019-07-12 ENCOUNTER — Other Ambulatory Visit: Payer: Self-pay

## 2019-07-12 ENCOUNTER — Encounter: Payer: Self-pay | Admitting: Emergency Medicine

## 2019-07-12 ENCOUNTER — Ambulatory Visit (INDEPENDENT_AMBULATORY_CARE_PROVIDER_SITE_OTHER): Payer: Managed Care, Other (non HMO) | Admitting: Emergency Medicine

## 2019-07-12 VITALS — BP 103/61 | HR 84 | Temp 97.8°F | Resp 16 | Ht 66.0 in | Wt 128.0 lb

## 2019-07-12 DIAGNOSIS — Z8619 Personal history of other infectious and parasitic diseases: Secondary | ICD-10-CM

## 2019-07-12 DIAGNOSIS — G8929 Other chronic pain: Secondary | ICD-10-CM

## 2019-07-12 DIAGNOSIS — M25562 Pain in left knee: Secondary | ICD-10-CM | POA: Diagnosis not present

## 2019-07-12 MED ORDER — MELOXICAM 7.5 MG PO TABS: 7.5000 mg | ORAL_TABLET | Freq: Every day | ORAL | 0 refills | Status: DC

## 2019-07-12 MED ORDER — CIPROFLOXACIN HCL 500 MG PO TABS: 500.0000 mg | ORAL_TABLET | Freq: Two times a day (BID) | ORAL | 0 refills | Status: AC

## 2019-07-12 MED ORDER — CYCLOBENZAPRINE HCL 5 MG PO TABS: 5.0000 mg | ORAL_TABLET | Freq: Three times a day (TID) | ORAL | 1 refills | Status: DC | PRN

## 2019-07-12 NOTE — Progress Notes (Signed)
Samuel Byrd 34 y.o.   Chief Complaint  Patient presents with  . Knee Pain    LEFT per pt suppose to have surgery this month, but not having it; Traveling to [place] 07/16/2019  . Medication Refill    Mobic and Cyclobenzaprine    HISTORY OF PRESENT ILLNESS: This is a 34 y.o. male with history of chronic pain to left knee scheduled for surgery but needs to travel to [place] first for his Dad's funeral.  Leaving next Saturday and returning by the end of the month.  Requesting medications for his knee as well as medication for traveler's diarrhea. No other complaints or medical concerns today.  HPI   Prior to Admission medications   Medication Sig Start Date End Date Taking? Authorizing Provider  cyclobenzaprine (FLEXERIL) 5 MG tablet Take 1 tablet (5 mg total) by mouth 3 (three) times daily as needed for muscle spasms. 07/12/19  Yes Samuel Byrd, Samuel Kempf, MD  meloxicam (MOBIC) 7.5 MG tablet Take 1 tablet (7.5 mg total) by mouth daily. Take only as needed. 07/12/19  Yes Samuel Byrd, Samuel Kempf, MD  ciprofloxacin (CIPRO) 500 MG tablet Take 1 tablet (500 mg total) by mouth 2 (two) times daily for 7 days. 07/12/19 07/19/19  Samuel Quint, MD    No Known Allergies  Patient Active Problem List   Diagnosis Date Noted  . 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 History of peptic ulcer disease 11/29/2018  . Gynecomastia 04/28/2017  . PUD (peptic ulcer disease) 10/09/2016    Past Medical History:  Diagnosis Date  . Gynecomastia 12/2017   right    Past Surgical History:  Procedure Laterality Date  . ESOPHAGOGASTRODUODENOSCOPY N/A 07/23/2015   Procedure: ESOPHAGOGASTRODUODENOSCOPY (EGD);  Surgeon: Samuel Rist, MD;  [facility];  Service: Endoscopy;  Laterality: N/A;  . GYNECOMASTIA MASTECTOMY Right 12/23/2017   Procedure: RIGHT SUBCUTANEOUS MASTECTOMY GYNECOMASTIA;  Surgeon: Samuel Rudd, MD;   Location: [facility];  Service: General;  Laterality: Right;    Social History   Socioeconomic History  . Marital status: Married    Spouse name: Not on file  . Number of children: 0  . Years of education: Not on file  . Highest education level: Not on file  Occupational History  . Occupation: [employer]  Tobacco Use  . Smoking status: Former Smoker    Quit date: 01/07/2017    Years since quitting: 2.5  . Smokeless tobacco: Never Used  Substance and Sexual Activity  . Alcohol use: Yes    Comment: occasionally  . Drug use: No  . Sexual activity: Not on file  Other Topics Concern  . Not on file  Social History Narrative  . Not on file   Social Determinants of Health   Financial Resource Strain:   . Difficulty of Paying Living Expenses:   Food Insecurity:   . Worried About Programme researcher, broadcasting/film/video in the Last Year:   . Barista in the Last Year:   Transportation Needs:   . Freight forwarder (Medical):   Marland Kitchen Lack of Transportation (Non-Medical):   Physical Activity:   . Days of Exercise per Week:   . Minutes of Exercise per Session:   Stress:   . Feeling of Stress :   Social Connections:   . Frequency of Communication with Friends and Family:   . Frequency of Social Gatherings with Friends and Family:   .  Attends Religious Services:   . Active Member of Clubs or Organizations:   . Attends Archivist Meetings:   Marland Kitchen Marital Status:   Intimate Partner Violence:   . Fear of Current or Ex-Partner:   . Emotionally Abused:   Marland Kitchen Physically Abused:   . Sexually Abused:     Family History  Problem Relation Age of Onset  . Heart attack Mother   . Ulcers Brother      Review of Systems  Constitutional: Negative.  Negative for chills and fever.  HENT: Negative.  Negative for congestion and sore throat.   Respiratory: Negative.  Negative for cough and shortness of breath.   Cardiovascular: Negative.  Negative for chest pain and  palpitations.  Gastrointestinal: Negative.  Negative for abdominal pain, diarrhea, nausea and vomiting.  Genitourinary: Negative.  Negative for dysuria and hematuria.  Musculoskeletal: Positive for joint pain (Chronic left knee pain).  Skin: Negative.  Negative for rash.  Neurological: Negative for dizziness and headaches.  All other systems reviewed and are negative.  Today's Vitals   07/12/19 1402  BP: 103/61  Pulse: 84  Resp: 16  Temp: 97.8 F (36.6 C)  TempSrc: Temporal  SpO2: 97%  Weight: 128 lb (58.1 kg)  Height: 5\' 6"  (1.676 m)   Body mass index is 20.66 kg/m.   Physical Exam Vitals reviewed.  Constitutional:      Appearance: Normal appearance.  HENT:     Head: Normocephalic.  Eyes:     Extraocular Movements: Extraocular movements intact.     Pupils: Pupils are equal, round, and reactive to light.  Cardiovascular:     Rate and Rhythm: Normal rate and regular rhythm.     Pulses: Normal pulses.     Heart sounds: Normal heart sounds.  Pulmonary:     Effort: Pulmonary effort is normal.     Breath sounds: Normal breath sounds.  Abdominal:     General: Bowel sounds are normal. There is no distension.     Palpations: Abdomen is soft.     Tenderness: There is no abdominal tenderness.  Musculoskeletal:        General: Normal range of motion.     Cervical back: Normal range of motion and neck supple.  Skin:    General: Skin is warm and dry.     Capillary Refill: Capillary refill takes less than 2 seconds.  Neurological:     General: No focal deficit present.     Mental Status: He is alert and oriented to person, place, and time.  Psychiatric:        Mood and Affect: Mood normal.        Behavior: Behavior normal.      ASSESSMENT & PLAN: [name] was seen today for knee pain and medication refill.  Diagnoses and all orders for this visit:  Chronic pain of left knee -     cyclobenzaprine (FLEXERIL) 5 MG tablet; Take 1 tablet (5 mg total) by mouth 3 (three)  times daily as needed for muscle spasms. -     meloxicam (MOBIC) 7.5 MG tablet; Take 1 tablet (7.5 mg total) by mouth daily. Take only as needed.  H/O traveler's diarrhea -     ciprofloxacin (CIPRO) 500 MG tablet; Take 1 tablet (500 mg total) by mouth 2 (two) times daily for 7 days.    Patient Instructions       If you have lab work done today you will be contacted with your lab results within the  next 2 weeks.  If you have not heard from [place] then please contact us. The fastest way to get your results is to register for My Chart.   IF you received an x-ray today, you will receive an invoice from [facility]. Please contact [facility] at [phone] with questions or concerns regarding your invoice.   IF you received labwork today, you will receive an invoice from [facility]. Please contact [facility] at [phone] with questions or concerns regarding your invoice.   Our billing staff will not be able to assist you with questions regarding bills from these companies.  You will be contacted with the lab results as soon as they are available. The fastest way to get your results is to activate your My Chart account. Instructions are located on the last page of this paperwork. If you have not heard from [place] regarding the results in 2 weeks, please contact this office.     Health Maintenance, Male Adopting a healthy lifestyle and getting preventive care are important in promoting health and wellness. Ask your health care provider about:  The right schedule for you to have regular tests and exams.  Things you can do on your own to prevent diseases and keep yourself healthy. What should I know about diet, weight, and exercise? Eat a healthy diet   Eat a diet that includes plenty of vegetables, fruits, low-fat dairy products, and lean protein.  Do not eat a lot of foods that are high in solid fats, added sugars, or sodium. Maintain a healthy weight Body mass index  (BMI) is a measurement that can be used to identify possible weight problems. It estimates body fat based on height and weight. Your health care provider can help determine your BMI and help you achieve or maintain a healthy weight. Get regular exercise Get regular exercise. This is one of the most important things you can do for your health. Most adults should:  Exercise for at least 150 minutes each week. The exercise should increase your heart rate and make you sweat (moderate-intensity exercise).  Do strengthening exercises at least twice a week. This is in addition to the moderate-intensity exercise.  Spend less time sitting. Even light physical activity can be beneficial. Watch cholesterol and blood lipids Have your blood tested for lipids and cholesterol at 36 years of age, then have this test every 5 years. You may need to have your cholesterol levels checked more often if:  Your lipid or cholesterol levels are high.  You are older than 36 years of age.  You are at high risk for heart disease. What should I know about cancer screening? Many types of cancers can be detected early and may often be prevented. Depending on your health history and family history, you may need to have cancer screening at various ages. This may include screening for:  Colorectal cancer.  Prostate cancer.  Skin cancer.  Lung cancer. What should I know about heart disease, diabetes, and high blood pressure? Blood pressure and heart disease  High blood pressure causes heart disease and increases the risk of stroke. This is more likely to develop in people who have high blood pressure readings, are of African descent, or are overweight.  Talk with your health care provider about your target blood pressure readings.  Have your blood pressure checked: ? Every 3-5 years if you are 63-46 years of age. ? Every year if you are 6 years old or older.  If you are between the ages  of 65 and 75 and are a  current or former smoker, ask your health care provider if you should have a one-time screening for abdominal aortic aneurysm (AAA). Diabetes Have regular diabetes screenings. This checks your fasting blood sugar level. Have the screening done:  Once every three years after age 70 if you are at a normal weight and have a low risk for diabetes.  More often and at a younger age if you are overweight or have a high risk for diabetes. What should I know about preventing infection? Hepatitis B If you have a higher risk for hepatitis B, you should be screened for this virus. Talk with your health care provider to find out if you are at risk for hepatitis B infection. Hepatitis C Blood testing is recommended for:  Everyone born from [birth year] through 1967.  Anyone with known risk factors for hepatitis C. Sexually transmitted infections (STIs)  You should be screened each year for STIs, including gonorrhea and chlamydia, if: ? You are sexually active and are younger than 36 years of age. ? You are older than 36 years of age and your health care provider tells you that you are at risk for this type of infection. ? Your sexual activity has changed since you were last screened, and you are at increased risk for chlamydia or gonorrhea. Ask your health care provider if you are at risk.  Ask your health care provider about whether you are at high risk for HIV. Your health care provider may recommend a prescription medicine to help prevent HIV infection. If you choose to take medicine to prevent HIV, you should first get tested for HIV. You should then be tested every 3 months for as long as you are taking the medicine. Follow these instructions at home: Lifestyle  Do not use any products that contain nicotine or tobacco, such as cigarettes, e-cigarettes, and chewing tobacco. If you need help quitting, ask your health care provider.  Do not use street drugs.  Do not share needles.  Ask your health care  provider for help if you need support or information about quitting drugs. Alcohol use  Do not drink alcohol if your health care provider tells you not to drink.  If you drink alcohol: ? Limit how much you have to 0-2 drinks a day. ? Be aware of how much alcohol is in your drink. In the [place] one drink equals one 12 oz bottle of beer (355 mL), one 5 oz glass of wine (148 mL), or one 1 oz glass of hard liquor (44 mL). General instructions  Schedule regular health, dental, and eye exams.  Stay current with your vaccines.  Tell your health care provider if: ? You often feel depressed. ? You have ever been abused or do not feel safe at home. Summary  Adopting a healthy lifestyle and getting preventive care are important in promoting health and wellness.  Follow your health care provider's instructions about healthy diet, exercising, and getting tested or screened for diseases.  Follow your health care provider's instructions on monitoring your cholesterol and blood pressure. This information is not intended to replace advice given to you by your health care provider. Make sure you discuss any questions you have with your health care provider. Document Revised: 02/17/2018 Document Reviewed: 02/17/2018 Elsevier Patient Education  2020 [employer] Inc.      Edwina Barth, MD [facility]

## 2019-07-12 NOTE — Patient Instructions (Addendum)
   If you have lab work done today you will be contacted with your lab results within the next 2 weeks.  If you have not heard from us then please contact us. The fastest way to get your results is to register for My Chart.   IF you received an x-ray today, you will receive an invoice from [facility]. Please contact [facility] at [phone] with questions or concerns regarding your invoice.   IF you received labwork today, you will receive an invoice from [facility]. Please contact [facility] at [phone] with questions or concerns regarding your invoice.   Our billing staff will not be able to assist you with questions regarding bills from these companies.  You will be contacted with the lab results as soon as they are available. The fastest way to get your results is to activate your My Chart account. Instructions are located on the last page of this paperwork. If you have not heard from us regarding the results in 2 weeks, please contact this office.      Health Maintenance, Male Adopting a healthy lifestyle and getting preventive care are important in promoting health and wellness. Ask your health care provider about:  The right schedule for you to have regular tests and exams.  Things you can do on your own to prevent diseases and keep yourself healthy. What should I know about diet, weight, and exercise? Eat a healthy diet   Eat a diet that includes plenty of vegetables, fruits, low-fat dairy products, and lean protein.  Do not eat a lot of foods that are high in solid fats, added sugars, or sodium. Maintain a healthy weight Body mass index (BMI) is a measurement that can be used to identify possible weight problems. It estimates body fat based on height and weight. Your health care provider can help determine your BMI and help you achieve or maintain a healthy weight. Get regular exercise Get regular exercise. This is one of the most important things you  can do for your health. Most adults should:  Exercise for at least 150 minutes each week. The exercise should increase your heart rate and make you sweat (moderate-intensity exercise).  Do strengthening exercises at least twice a week. This is in addition to the moderate-intensity exercise.  Spend less time sitting. Even light physical activity can be beneficial. Watch cholesterol and blood lipids Have your blood tested for lipids and cholesterol at 36 years of age, then have this test every 5 years. You may need to have your cholesterol levels checked more often if:  Your lipid or cholesterol levels are high.  You are older than 36 years of age.  You are at high risk for heart disease. What should I know about cancer screening? Many types of cancers can be detected early and may often be prevented. Depending on your health history and family history, you may need to have cancer screening at various ages. This may include screening for:  Colorectal cancer.  Prostate cancer.  Skin cancer.  Lung cancer. What should I know about heart disease, diabetes, and high blood pressure? Blood pressure and heart disease  High blood pressure causes heart disease and increases the risk of stroke. This is more likely to develop in people who have high blood pressure readings, are of African descent, or are overweight.  Talk with your health care provider about your target blood pressure readings.  Have your blood pressure checked: ? Every 3-5 years if you are 18-39   years of age. ? Every year if you are 40 years old or older.  If you are between the ages of 65 and 75 and are a current or former smoker, ask your health care provider if you should have a one-time screening for abdominal aortic aneurysm (AAA). Diabetes Have regular diabetes screenings. This checks your fasting blood sugar level. Have the screening done:  Once every three years after age 45 if you are at a normal weight and have  a low risk for diabetes.  More often and at a younger age if you are overweight or have a high risk for diabetes. What should I know about preventing infection? Hepatitis B If you have a higher risk for hepatitis B, you should be screened for this virus. Talk with your health care provider to find out if you are at risk for hepatitis B infection. Hepatitis C Blood testing is recommended for:  Everyone born from 1945 through 1965.  Anyone with known risk factors for hepatitis C. Sexually transmitted infections (STIs)  You should be screened each year for STIs, including gonorrhea and chlamydia, if: ? You are sexually active and are younger than 36 years of age. ? You are older than 36 years of age and your health care provider tells you that you are at risk for this type of infection. ? Your sexual activity has changed since you were last screened, and you are at increased risk for chlamydia or gonorrhea. Ask your health care provider if you are at risk.  Ask your health care provider about whether you are at high risk for HIV. Your health care provider may recommend a prescription medicine to help prevent HIV infection. If you choose to take medicine to prevent HIV, you should first get tested for HIV. You should then be tested every 3 months for as long as you are taking the medicine. Follow these instructions at home: Lifestyle  Do not use any products that contain nicotine or tobacco, such as cigarettes, e-cigarettes, and chewing tobacco. If you need help quitting, ask your health care provider.  Do not use street drugs.  Do not share needles.  Ask your health care provider for help if you need support or information about quitting drugs. Alcohol use  Do not drink alcohol if your health care provider tells you not to drink.  If you drink alcohol: ? Limit how much you have to 0-2 drinks a day. ? Be aware of how much alcohol is in your drink. In the [place] one drink equals one 12  oz bottle of beer (355 mL), one 5 oz glass of wine (148 mL), or one 1 oz glass of hard liquor (44 mL). General instructions  Schedule regular health, dental, and eye exams.  Stay current with your vaccines.  Tell your health care provider if: ? You often feel depressed. ? You have ever been abused or do not feel safe at home. Summary  Adopting a healthy lifestyle and getting preventive care are important in promoting health and wellness.  Follow your health care provider's instructions about healthy diet, exercising, and getting tested or screened for diseases.  Follow your health care provider's instructions on monitoring your cholesterol and blood pressure. This information is not intended to replace advice given to you by your health care provider. Make sure you discuss any questions you have with your health care provider. Document Revised: 02/17/2018 Document Reviewed: 02/17/2018 Elsevier Patient Education  2020 Elsevier Inc.  

## 2019-08-04 ENCOUNTER — Other Ambulatory Visit: Payer: Self-pay | Admitting: Emergency Medicine

## 2019-08-04 DIAGNOSIS — M25562 Pain in left knee: Secondary | ICD-10-CM

## 2019-09-04 ENCOUNTER — Other Ambulatory Visit: Payer: Self-pay | Admitting: Emergency Medicine

## 2019-09-04 DIAGNOSIS — M25562 Pain in left knee: Secondary | ICD-10-CM

## 2019-09-04 NOTE — Telephone Encounter (Signed)
Requested Prescriptions  Pending Prescriptions Disp Refills  . meloxicam (MOBIC) 7.5 MG tablet [Pharmacy Med Name: MELOXICAM 7.5 MG TABLET] 30 tablet 0    Sig: TAKE 1 TABLET BY MOUTH DAILY ONLY AS NEEDED     Analgesics:  COX2 Inhibitors Passed - 09/04/2019  9:18 AM      Passed - HGB in normal range and within 360 days    Hemoglobin  Date Value Ref Range Status  11/29/2018 14.8 13.0 - 17.7 g/dL Final         Passed - Cr in normal range and within 360 days    Creatinine, Ser  Date Value Ref Range Status  11/29/2018 1.06 0.76 - 1.27 mg/dL Final         Passed - Patient is not pregnant      Passed - Valid encounter within last 12 months    Recent Outpatient Visits          1 month ago Chronic pain of left knee   Primary Care at [facility], [place], [place]   6 months ago Left knee pain, unspecified chronicity   [facility] at [facility], Gerlene Burdock, NP   9 months ago Epigastric discomfort   Primary Care at [facility], Eilleen Kempf, MD   1 year ago Gynecomastia   [facility] at [facility], Eilleen Kempf, MD   2 years ago Gynecomastia   Primary Care at [facility], [place], [place]

## 2020-03-16 ENCOUNTER — Ambulatory Visit: Payer: Managed Care, Other (non HMO) | Attending: Internal Medicine

## 2020-03-16 DIAGNOSIS — Z23 Encounter for immunization: Secondary | ICD-10-CM

## 2020-03-16 NOTE — Progress Notes (Signed)
   Covid-19 Vaccination Clinic  Name:  Orland Visconti    MRN: [id number] DOB: [birth year]  03/16/2020  Mr. Fuchs was observed post Covid-19 immunization for 15 minutes without incident. He was provided with Vaccine Information Sheet and instruction to access the V-Safe system.   Mr. Silverio was instructed to call 911 with any severe reactions post vaccine: Marland Kitchen Difficulty breathing  . Swelling of face and throat  . A fast heartbeat  . A bad rash all over body  . Dizziness and weakness   Immunizations Administered    Name Date Dose VIS Date Route   Pfizer COVID-19 Vaccine 03/16/2020  3:07 PM 0.3 mL 12/28/2019 Intramuscular   Manufacturer: [employer], [employer]   Lot: [id number]   NDC: [address]-8

## 2020-05-16 ENCOUNTER — Ambulatory Visit (INDEPENDENT_AMBULATORY_CARE_PROVIDER_SITE_OTHER): Payer: Managed Care, Other (non HMO) | Admitting: Emergency Medicine

## 2020-05-16 ENCOUNTER — Other Ambulatory Visit: Payer: Self-pay

## 2020-05-16 ENCOUNTER — Encounter: Payer: Self-pay | Admitting: Emergency Medicine

## 2020-05-16 VITALS — BP 111/58 | HR 73 | Temp 97.6°F | Resp 16 | Ht 64.0 in | Wt 137.0 lb

## 2020-05-16 DIAGNOSIS — K2900 Acute gastritis without bleeding: Secondary | ICD-10-CM

## 2020-05-16 DIAGNOSIS — R1013 Epigastric pain: Secondary | ICD-10-CM | POA: Diagnosis not present

## 2020-05-16 DIAGNOSIS — Z298 Encounter for other specified prophylactic measures: Secondary | ICD-10-CM

## 2020-05-16 MED ORDER — ATOVAQUONE-PROGUANIL HCL 250-100 MG PO TABS: 1.0000 | ORAL_TABLET | Freq: Every day | ORAL | 1 refills | Status: DC

## 2020-05-16 MED ORDER — PANTOPRAZOLE SODIUM 40 MG PO TBEC: 40.0000 mg | DELAYED_RELEASE_TABLET | Freq: Every day | ORAL | 3 refills | Status: DC

## 2020-05-16 NOTE — Progress Notes (Signed)
Samuel Byrd 35 y.o.   Chief Complaint  Patient presents with  . Abdominal Pain    Per patient for 2 weeks with acid reflux, no appetite. Denies nausea or vomiting    HISTORY OF PRESENT ILLNESS: This is a 35 y.o. male complaining of epigastric burning pain triggered by spicy foods that started about 2 weeks ago.  Denies nausea or vomiting.  Able to eat and drink.  Denies fever or chills.  Denies rectal bleeding or melena.  Denies any other associated symptoms. Traveling to [place] later this month, requesting malaria prophylaxis. No other complaints or medical concerns today.  HPI   Prior to Admission medications   Medication Sig Start Date End Date Taking? Authorizing Provider  cyclobenzaprine (FLEXERIL) 5 MG tablet Take 1 tablet (5 mg total) by mouth 3 (three) times daily as needed for muscle spasms. Patient not taking: Reported on 05/16/2020 07/12/19   Samuel Byrd  meloxicam (MOBIC) 7.5 MG tablet TAKE 1 TABLET BY MOUTH DAILY ONLY AS NEEDED Patient not taking: Reported on 05/16/2020 09/04/19   Samuel Byrd    Not on File  Patient Active Problem List   Diagnosis Date Noted  . 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 History of peptic ulcer disease 11/29/2018  . Gynecomastia 04/28/2017  . PUD (peptic ulcer disease) 10/09/2016    Past Medical History:  Diagnosis Date  . Gynecomastia 12/2017   right    Past Surgical History:  Procedure Laterality Date  . ESOPHAGOGASTRODUODENOSCOPY N/A 07/23/2015   Procedure: ESOPHAGOGASTRODUODENOSCOPY (EGD);  Surgeon: Sherrilyn RistHenry L Danis III, Byrd;  Location: [facility];  Service: Endoscopy;  Laterality: N/A;  . GYNECOMASTIA MASTECTOMY Right 12/23/2017   Procedure: RIGHT SUBCUTANEOUS MASTECTOMY GYNECOMASTIA;  Surgeon: Samuel Byrd, Samuel Byrd;  Location: [facility];  Service: General;  Laterality: Right;    Social History   Socioeconomic History  .  Marital status: Married    Spouse name: Not on file  . Number of children: 0  . Years of education: Not on file  . Highest education level: Not on file  Occupational History  . Occupation: [employer]  Tobacco Use  . Smoking status: Former Smoker    Quit date: 01/07/2017    Years since quitting: 3.3  . Smokeless tobacco: Never Used  Vaping Use  . Vaping Use: Never used  Substance and Sexual Activity  . Alcohol use: Yes    Comment: occasionally  . Drug use: No  . Sexual activity: Not on file  Other Topics Concern  . Not on file  Social History Narrative  . Not on file   Social Determinants of Health   Financial Resource Strain: Not on file  Food Insecurity: Not on file  Transportation Needs: Not on file  Physical Activity: Not on file  Stress: Not on file  Social Connections: Not on file  Intimate Partner Violence: Not on file    Family History  Problem Relation Age of Onset  . Heart attack Mother   . Ulcers Brother      Review of Systems  Constitutional: Negative.  Negative for chills and fever.  HENT: Negative.  Negative for congestion and sore throat.   Respiratory: Negative.  Negative for cough and shortness of breath.   Cardiovascular: Negative.  Negative for chest pain and palpitations.  Gastrointestinal: Positive for abdominal pain and heartburn. Negative for blood in stool, diarrhea, melena, nausea and vomiting.  Genitourinary: Negative.  Negative for dysuria and hematuria.  Musculoskeletal: Negative.  Negative for myalgias.  Skin: Negative.  Negative for rash.  Neurological: Negative.  Negative for dizziness and headaches.  All other systems reviewed and are negative.  Today's Vitals   05/16/20 1443  BP: (!) 111/58  Pulse: 73  Resp: 16  Temp: 97.6 F (36.4 C)  TempSrc: Temporal  SpO2: 98%  Weight: 137 lb (62.1 kg)  Height: 5\' 4"  (1.626 m)   Body mass index is 23.52 kg/m. Wt Readings from Last 3 Encounters:  05/16/20 137 lb (62.1 kg)   07/12/19 128 lb (58.1 kg)  02/11/19 134 lb 9.6 oz (61.1 kg)     Physical Exam Vitals reviewed.  Constitutional:      Appearance: He is well-developed.  HENT:     Head: Normocephalic.     Mouth/Throat:     Mouth: Mucous membranes are moist.     Pharynx: Oropharynx is clear.  Eyes:     Extraocular Movements: Extraocular movements intact.     Conjunctiva/sclera: Conjunctivae normal.     Pupils: Pupils are equal, round, and reactive to light.  Cardiovascular:     Rate and Rhythm: Normal rate and regular rhythm.     Pulses: Normal pulses.     Heart sounds: Normal heart sounds.  Pulmonary:     Effort: Pulmonary effort is normal.     Breath sounds: Normal breath sounds.  Abdominal:     General: Bowel sounds are normal. There is no distension.     Palpations: Abdomen is soft.     Tenderness: There is no abdominal tenderness.  Musculoskeletal:        General: Normal range of motion.     Cervical back: Normal range of motion and neck supple.  Skin:    General: Skin is warm and dry.     Capillary Refill: Capillary refill takes less than 2 seconds.  Neurological:     General: No focal deficit present.     Mental Status: He is alert and oriented to person, place, and time.  Psychiatric:        Mood and Affect: Mood normal.        Behavior: Behavior normal.     A total of 30 minutes was spent with the patient, greater than 50% of which was in counseling/coordination of care regarding differential diagnosis of epigastric pain including possibility of peptic ulcer disease, gastritis, gastroesophageal reflux, treatment and management, medications prescribed, diet and nutrition in particular foods to avoid, prognosis, review of most recent office visit notes, documentation, and need for follow-up.  ASSESSMENT & PLAN: Samuel Byrd was seen today for abdominal pain.  Diagnoses and all orders for this visit:  Acute gastritis without hemorrhage, unspecified gastritis type -      pantoprazole (PROTONIX) 40 MG tablet; Take 1 tablet (40 mg total) by mouth daily.  Epigastric pain  Need for malaria prophylaxis  Other orders -     atovaquone-proguanil (MALARONE) 250-100 MG TABS tablet; Take 1 tablet by mouth daily. Start 1 to 2 days before traveling, daily while in [place], and continue daily for 7 days after returning.     Patient Instructions       If you have lab work done today you will be contacted with your lab results within the next 2 weeks.  If you have not heard from [place] then please contact us. The fastest way to get your results is to register for My Chart.   IF you received an x-ray  today, you will receive an invoice from [facility]. Please contact [facility] at [phone] with questions or concerns regarding your invoice.   IF you received labwork today, you will receive an invoice from [place]. Please contact [facility] at [phone] with questions or concerns regarding your invoice.   Our billing staff will not be able to assist you with questions regarding bills from these companies.  You will be contacted with the lab results as soon as they are available. The fastest way to get your results is to activate your My Chart account. Instructions are located on the last page of this paperwork. If you have not heard from [place] regarding the results in 2 weeks, please contact this office.     Gastritis, Adult  Gastritis is swelling (inflammation) of the stomach. Gastritis can develop quickly (acute). It can also develop slowly over time (chronic). It is important to get help for this condition. If you do not get help, your stomach can bleed, and you can get sores (ulcers) in your stomach. What are the causes? This condition may be caused by:  Germs that get to your stomach.  Drinking too much alcohol.  Medicines you are taking.  Too much acid in the stomach.  A disease of the intestines or stomach.  Stress.  An allergic  reaction.  Crohn's disease.  Some cancer treatments (radiation). Sometimes the cause of this condition is not known. What are the signs or symptoms? Symptoms of this condition include:  Pain in your stomach.  A burning feeling in your stomach.  Feeling sick to your stomach (nauseous).  Throwing up (vomiting).  Feeling too full after you eat.  Weight loss.  Bad breath.  Throwing up blood.  Blood in your poop (stool). How is this diagnosed? This condition may be diagnosed with:  Your medical history and symptoms.  A physical exam.  Tests. These can include: ? Blood tests. ? Stool tests. ? A procedure to look inside your stomach (upper endoscopy). ? A test in which a sample of tissue is taken for testing (biopsy). How is this treated? Treatment for this condition depends on what caused it. You may be given:  Antibiotic medicine, if your condition was caused by germs.  H2 blockers and similar medicines, if your condition was caused by too much acid. Follow these instructions at home: Medicines  Take over-the-counter and prescription medicines only as told by your doctor.  If you were prescribed an antibiotic medicine, take it as told by your doctor. Do not stop taking it even if you start to feel better. Eating and drinking  Eat small meals often, instead of large meals.  Avoid foods and drinks that make your symptoms worse.  Drink enough fluid to keep your pee (urine) pale yellow.   Alcohol use  Do not drink alcohol if: ? Your doctor tells you not to drink. ? You are pregnant, may be pregnant, or are planning to become pregnant.  If you drink alcohol: ? Limit your use to:  0-1 drink a day for women.  0-2 drinks a day for men. ? Be aware of how much alcohol is in your drink. In the [place] one drink equals one 12 oz bottle of beer (355 mL), one 5 oz glass of wine (148 mL), or one 1 oz glass of hard liquor (44 mL). General instructions  Talk with your  doctor about ways to manage stress. You can exercise or do deep breathing, meditation, or yoga.  Do not  smoke or use products that have nicotine or tobacco. If you need help quitting, ask your doctor.  Keep all follow-up visits as told by your doctor. This is important. Contact a doctor if:  Your symptoms get worse.  Your symptoms go away and then come back. Get help right away if:  You throw up blood or something that looks like coffee grounds.  You have black or dark red poop.  You throw up any time you try to drink fluids.  Your stomach pain gets worse.  You have a fever.  You do not feel better after one week. Summary  Gastritis is swelling (inflammation) of the stomach.  You must get help for this condition. If you do not get help, your stomach can bleed, and you can get sores (ulcers).  This condition is diagnosed with medical history, physical exam, or tests.  You can be treated with medicines for germs or medicines to block too much acid in your stomach. This information is not intended to replace advice given to you by your health care provider. Make sure you discuss any questions you have with your health care provider. Document Revised: 07/14/2017 Document Reviewed: 07/14/2017 Elsevier Patient Education  2021 Elsevier Inc.      Edwina Barth, Byrd [facility]

## 2020-05-16 NOTE — Patient Instructions (Addendum)
If you have lab work done today you will be contacted with your lab results within the next 2 weeks.  If you have not heard from [place] then please contact us. The fastest way to get your results is to register for My Chart.   IF you received an x-ray today, you will receive an invoice from [facility]. Please contact [facility] at [phone] with questions or concerns regarding your invoice.   IF you received labwork today, you will receive an invoice from [facility]. Please contact [facility] at [phone] with questions or concerns regarding your invoice.   Our billing staff will not be able to assist you with questions regarding bills from these companies.  You will be contacted with the lab results as soon as they are available. The fastest way to get your results is to activate your My Chart account. Instructions are located on the last page of this paperwork. If you have not heard from [place] regarding the results in 2 weeks, please contact this office.     Gastritis, Adult  Gastritis is swelling (inflammation) of the stomach. Gastritis can develop quickly (acute). It can also develop slowly over time (chronic). It is important to get help for this condition. If you do not get help, your stomach can bleed, and you can get sores (ulcers) in your stomach. What are the causes? This condition may be caused by:  Germs that get to your stomach.  Drinking too much alcohol.  Medicines you are taking.  Too much acid in the stomach.  A disease of the intestines or stomach.  Stress.  An allergic reaction.  Crohn's disease.  Some cancer treatments (radiation). Sometimes the cause of this condition is not known. What are the signs or symptoms? Symptoms of this condition include:  Pain in your stomach.  A burning feeling in your stomach.  Feeling sick to your stomach (nauseous).  Throwing up (vomiting).  Feeling too full after you eat.  Weight  loss.  Bad breath.  Throwing up blood.  Blood in your poop (stool). How is this diagnosed? This condition may be diagnosed with:  Your medical history and symptoms.  A physical exam.  Tests. These can include: ? Blood tests. ? Stool tests. ? A procedure to look inside your stomach (upper endoscopy). ? A test in which a sample of tissue is taken for testing (biopsy). How is this treated? Treatment for this condition depends on what caused it. You may be given:  Antibiotic medicine, if your condition was caused by germs.  H2 blockers and similar medicines, if your condition was caused by too much acid. Follow these instructions at home: Medicines  Take over-the-counter and prescription medicines only as told by your doctor.  If you were prescribed an antibiotic medicine, take it as told by your doctor. Do not stop taking it even if you start to feel better. Eating and drinking  Eat small meals often, instead of large meals.  Avoid foods and drinks that make your symptoms worse.  Drink enough fluid to keep your pee (urine) pale yellow.   Alcohol use  Do not drink alcohol if: ? Your doctor tells you not to drink. ? You are pregnant, may be pregnant, or are planning to become pregnant.  If you drink alcohol: ? Limit your use to:  0-1 drink a day for women.  0-2 drinks a day for men. ? Be aware of how much alcohol is in your drink. In the [place]  one drink equals one 12 oz bottle of beer (355 mL), one 5 oz glass of wine (148 mL), or one 1 oz glass of hard liquor (44 mL). General instructions  Talk with your doctor about ways to manage stress. You can exercise or do deep breathing, meditation, or yoga.  Do not smoke or use products that have nicotine or tobacco. If you need help quitting, ask your doctor.  Keep all follow-up visits as told by your doctor. This is important. Contact a doctor if:  Your symptoms get worse.  Your symptoms go away and then come  back. Get help right away if:  You throw up blood or something that looks like coffee grounds.  You have black or dark red poop.  You throw up any time you try to drink fluids.  Your stomach pain gets worse.  You have a fever.  You do not feel better after one week. Summary  Gastritis is swelling (inflammation) of the stomach.  You must get help for this condition. If you do not get help, your stomach can bleed, and you can get sores (ulcers).  This condition is diagnosed with medical history, physical exam, or tests.  You can be treated with medicines for germs or medicines to block too much acid in your stomach. This information is not intended to replace advice given to you by your health care provider. Make sure you discuss any questions you have with your health care provider. Document Revised: 07/14/2017 Document Reviewed: 07/14/2017 Elsevier Patient Education  2021 [employer].

## 2020-07-07 ENCOUNTER — Other Ambulatory Visit: Payer: Self-pay | Admitting: Emergency Medicine

## 2020-07-07 DIAGNOSIS — K2900 Acute gastritis without bleeding: Secondary | ICD-10-CM

## 2020-07-19 IMAGING — DX DG KNEE COMPLETE 4+V*L*
4 series · 4 of 4 positions shown · non-contrast
Comparison: None.

CLINICAL DATA: Left knee pain.

EXAM:
LEFT KNEE - COMPLETE 4+ VIEW

[knee ap]
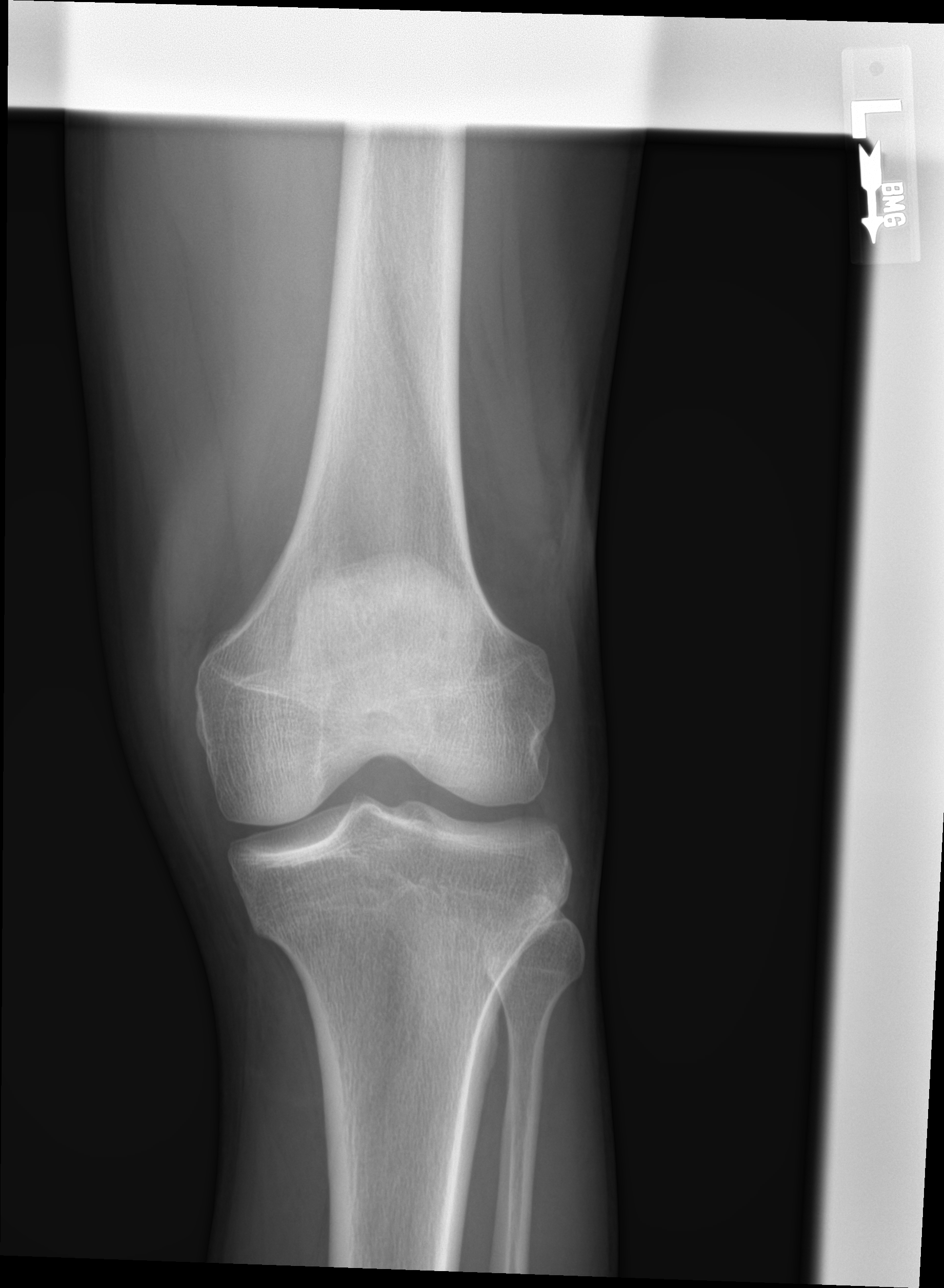

[knee lat]
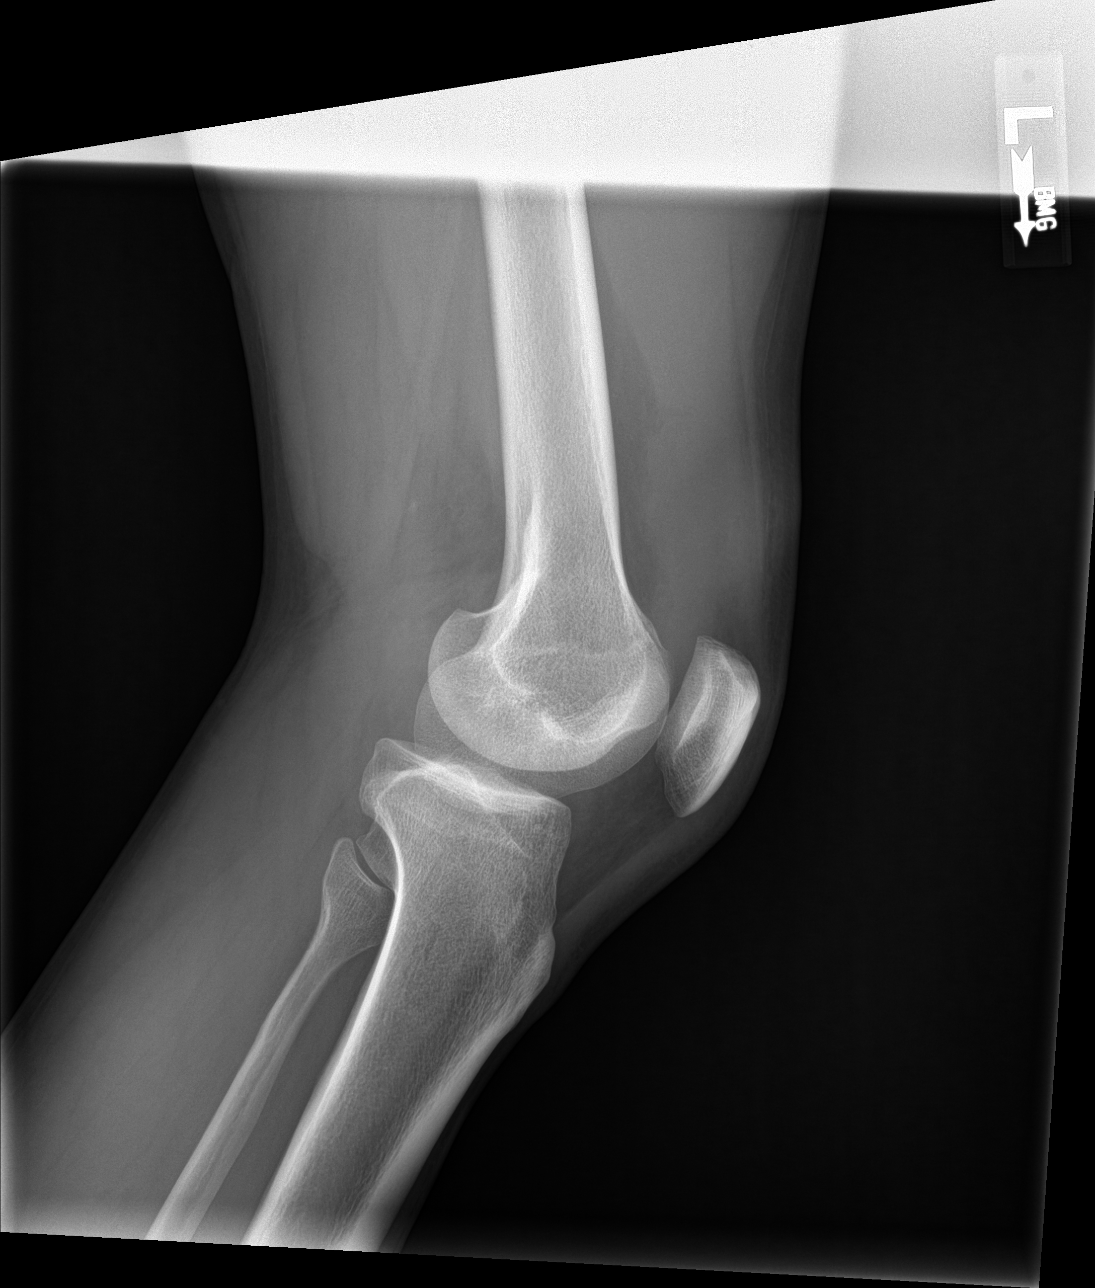

[sunrise]
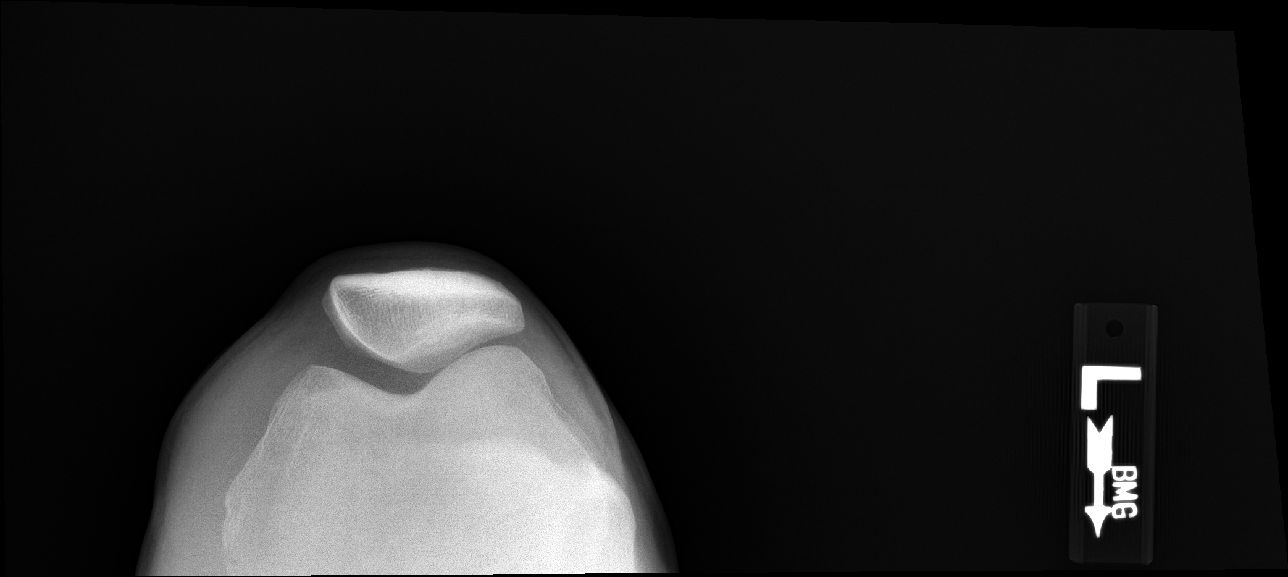

[knee [person_name]]
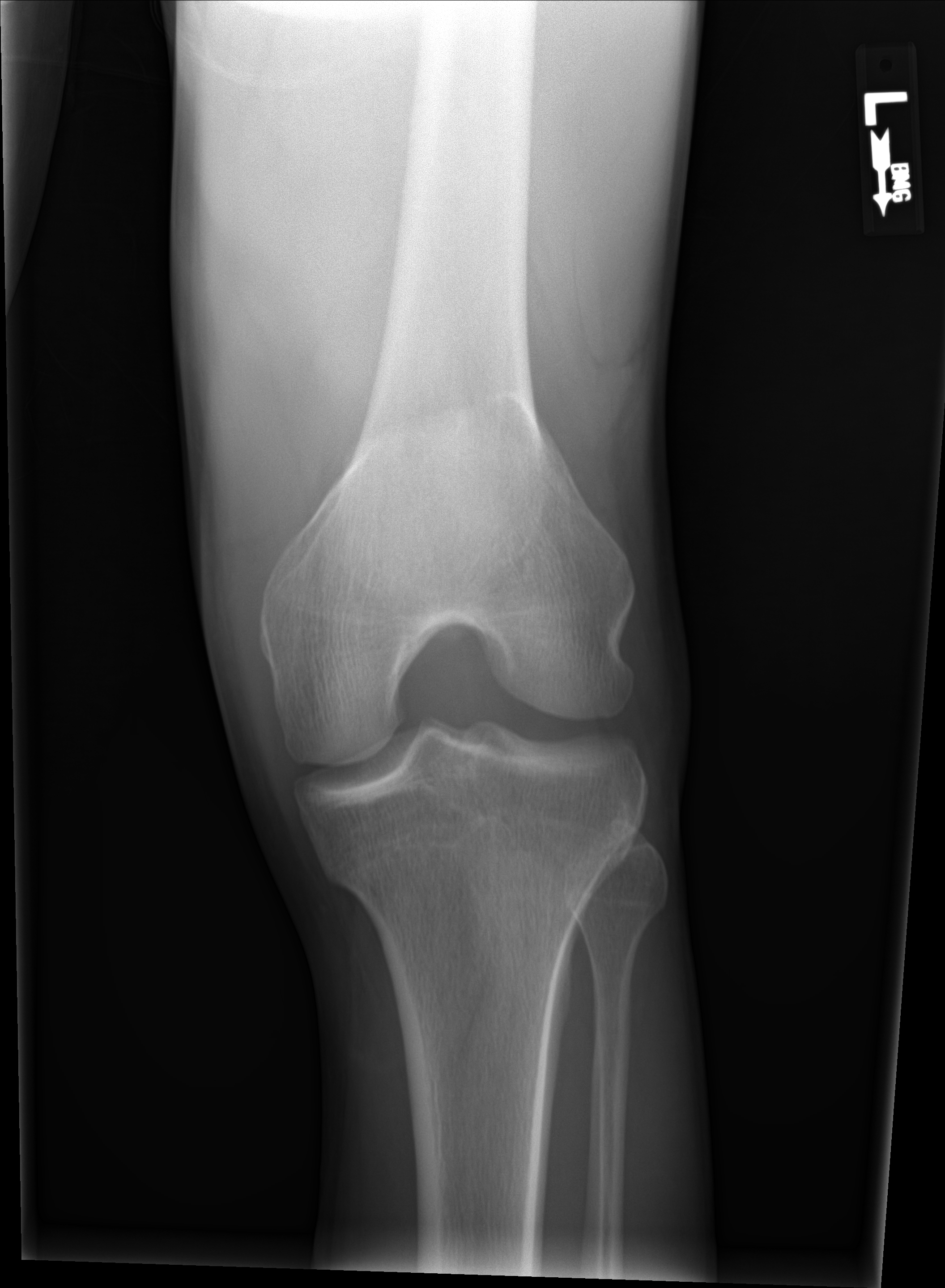

[4 of 4 positions shown; findings below may reference images not displayed]

FINDINGS: The left knee is located. No acute osseous abnormalities are
present. Moderate-sized joint effusion is present. Is slight lateral
tracking of the patella.
IMPRESSION: 1. Moderate-sized joint effusion. Internal derangement is not
excluded. Inflammatory arthritis is considered less likely.
2. No acute or focal osseous abnormality.
3. Slight lateral tracking of the patella.

## 2020-11-19 ENCOUNTER — Ambulatory Visit (INDEPENDENT_AMBULATORY_CARE_PROVIDER_SITE_OTHER): Payer: Managed Care, Other (non HMO) | Admitting: Emergency Medicine

## 2020-11-19 ENCOUNTER — Other Ambulatory Visit: Payer: Self-pay

## 2020-11-19 ENCOUNTER — Encounter: Payer: Self-pay | Admitting: Emergency Medicine

## 2020-11-19 VITALS — BP 110/62 | HR 76 | Temp 98.5°F | Ht 64.0 in | Wt 137.0 lb

## 2020-11-19 DIAGNOSIS — R1013 Epigastric pain: Secondary | ICD-10-CM

## 2020-11-19 DIAGNOSIS — K2900 Acute gastritis without bleeding: Secondary | ICD-10-CM

## 2020-11-19 MED ORDER — PANTOPRAZOLE SODIUM 40 MG PO TBEC: 40.0000 mg | DELAYED_RELEASE_TABLET | Freq: Every day | ORAL | 1 refills | Status: DC

## 2020-11-19 MED ORDER — FAMOTIDINE 40 MG PO TABS: 40.0000 mg | ORAL_TABLET | Freq: Every day | ORAL | 1 refills | Status: DC

## 2020-11-19 NOTE — Patient Instructions (Signed)
Gastritis, Adult  Gastritis is swelling (inflammation) of the stomach. Gastritis can develop quickly (acute). It can also develop slowly over time (chronic). It is important to get help for this condition. If you do not get help, your stomach can bleed, and you can get sores (ulcers) in your stomach. What are the causes? This condition may be caused by: Germs that get to your stomach. Drinking too much alcohol. Medicines you are taking. Too much acid in the stomach. A disease of the intestines or stomach. Stress. An allergic reaction. Crohn's disease. Some cancer treatments (radiation). Sometimes the cause of this condition is not known. What are the signs or symptoms? Symptoms of this condition include: Pain in your stomach. A burning feeling in your stomach. Feeling sick to your stomach (nauseous). Throwing up (vomiting). Feeling too full after you eat. Weight loss. Bad breath. Throwing up blood. Blood in your poop (stool). How is this diagnosed? This condition may be diagnosed with: Your medical history and symptoms. A physical exam. Tests. These can include: Blood tests. Stool tests. A procedure to look inside your stomach (upper endoscopy). A test in which a sample of tissue is taken for testing (biopsy). How is this treated? Treatment for this condition depends on what caused it. You may be given: Antibiotic medicine, if your condition was caused by germs. H2 blockers and similar medicines, if your condition was caused by too much acid. Follow these instructions at home: Medicines Take over-the-counter and prescription medicines only as told by your doctor. If you were prescribed an antibiotic medicine, take it as told by your doctor. Do not stop taking it even if you start to feel better. Eating and drinking  Eat small meals often, instead of large meals. Avoid foods and drinks that make your symptoms worse. Drink enough fluid to keep your pee (urine) pale  yellow.  Alcohol use Do not drink alcohol if: Your doctor tells you not to drink. You are pregnant, may be pregnant, or are planning to become pregnant. If you drink alcohol: Limit your use to: 0-1 drink a day for women. 0-2 drinks a day for men. Be aware of how much alcohol is in your drink. In the [place] one drink equals one 12 oz bottle of beer (355 mL), one 5 oz glass of wine (148 mL), or one 1 oz glass of hard liquor (44 mL). General instructions Talk with your doctor about ways to manage stress. You can exercise or do deep breathing, meditation, or yoga. Do not smoke or use products that have nicotine or tobacco. If you need help quitting, ask your doctor. Keep all follow-up visits as told by your doctor. This is important. Contact a doctor if: Your symptoms get worse. Your symptoms go away and then come back. Get help right away if: You throw up blood or something that looks like coffee grounds. You have black or dark red poop. You throw up any time you try to drink fluids. Your stomach pain gets worse. You have a fever. You do not feel better after one week. Summary Gastritis is swelling (inflammation) of the stomach. You must get help for this condition. If you do not get help, your stomach can bleed, and you can get sores (ulcers). This condition is diagnosed with medical history, physical exam, or tests. You can be treated with medicines for germs or medicines to block too much acid in your stomach. This information is not intended to replace advice given to you by your health care   provider. Make sure you discuss any questions you have with your healthcare provider. Document Revised: 07/14/2017 Document Reviewed: 07/14/2017 Elsevier Patient Education  2022 Elsevier Inc.  

## 2020-11-19 NOTE — Progress Notes (Signed)
Samuel Byrd 35 y.o.   Chief Complaint  Patient presents with   Ulcer    Pt states he has hx of ulcers, abd pain x 2 week    HISTORY OF PRESENT ILLNESS: This is a 35 y.o. male complaining of epigastric pain for the past 2 to 3 weeks. Able to eat and drink.  Denies nausea or vomiting.  Denies hematemesis, rectal bleeding or melena.  Denies diarrhea. Non-smoker.  Denies EtOH abuse.  Denies NSAIDs abuse. No other significant associated symptomatology.  HPI   Prior to Admission medications   Medication Sig Start Date End Date Taking? Authorizing Provider  atovaquone-proguanil (MALARONE) 250-100 MG TABS tablet Take 1 tablet by mouth daily. Start 1 to 2 days before traveling, daily while in [place], and continue daily for 7 days after returning. Patient not taking: Reported on 11/19/2020 05/16/20   Georgina Quint, MD  cyclobenzaprine (FLEXERIL) 5 MG tablet Take 1 tablet (5 mg total) by mouth 3 (three) times daily as needed for muscle spasms. 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   History of peptic ulcer disease 11/29/2018   Gynecomastia 04/28/2017   PUD (peptic ulcer disease) 10/09/2016    Past Medical History:  Diagnosis Date   Gynecomastia 12/2017   right    Past Surgical History:  Procedure Laterality Date   ESOPHAGOGASTRODUODENOSCOPY N/A 07/23/2015   Procedure: ESOPHAGOGASTRODUODENOSCOPY (EGD);  Surgeon: Sherrilyn Rist, MD;  Location: [facility];  Service: Endoscopy;   Laterality: N/A;   GYNECOMASTIA MASTECTOMY Right 12/23/2017   Procedure: RIGHT SUBCUTANEOUS MASTECTOMY GYNECOMASTIA;  Surgeon: Manus Rudd, MD;  Location: [facility];  Service: General;  Laterality: Right;    Social History   Socioeconomic History   Marital status: Married    Spouse name: Not on file   Number of children: 0   Years of education: Not on file   Highest education level: Not on file  Occupational History   Occupation: [employer]  Tobacco Use   Smoking status: Former    Types: Cigarettes    Quit date: 01/07/2017    Years since quitting: 3.8   Smokeless tobacco: Never  Vaping Use   Vaping Use: Never used  Substance and Sexual Activity   Alcohol use: Yes    Comment: occasionally   Drug use: No   Sexual activity: Not on file  Other Topics Concern   Not on file  Social History Narrative   Not on file   Social Determinants of Health   Financial Resource Strain: Not on file  Food Insecurity: Not on file  Transportation Needs: Not on file  Physical Activity: Not on file  Stress: Not on file  Social Connections: Not on file  Intimate Partner Violence: Not on file    Family History  Problem Relation Age of Onset   Heart attack Mother    Ulcers Brother      Review of Systems  Constitutional: Negative.  Negative for  chills and fever.  HENT: Negative.  Negative for congestion and sore throat.   Respiratory: Negative.  Negative for cough and shortness of breath.   Cardiovascular: Negative.  Negative for chest pain and palpitations.  Gastrointestinal:  Positive for abdominal pain and heartburn. Negative for blood in stool, constipation, diarrhea, melena, nausea and vomiting.  Genitourinary: Negative.  Negative for dysuria and hematuria.  Skin: Negative.  Negative for rash.  Neurological: Negative.  Negative for dizziness, loss of consciousness and headaches.  All other systems reviewed and are negative.  Today's Vitals   11/19/20  1554  BP: 110/62  Pulse: 76  Temp: 98.5 F (36.9 C)  TempSrc: Oral  SpO2: 95%  Weight: 137 lb (62.1 kg)  Height: 5\' 4"  (1.626 m)   Body mass index is 23.52 kg/m.  Physical Exam Vitals reviewed.  Constitutional:      Appearance: Normal appearance.  HENT:     Head: Normocephalic.  Eyes:     Extraocular Movements: Extraocular movements intact.     Conjunctiva/sclera: Conjunctivae normal.     Pupils: Pupils are equal, round, and reactive to light.  Cardiovascular:     Rate and Rhythm: Normal rate and regular rhythm.     Pulses: Normal pulses.     Heart sounds: Normal heart sounds.  Pulmonary:     Effort: Pulmonary effort is normal.     Breath sounds: Normal breath sounds.  Abdominal:     General: Bowel sounds are normal. There is no distension.     Palpations: Abdomen is soft. There is no mass.     Tenderness: There is abdominal tenderness (Mild epigastric tenderness).  Musculoskeletal:        General: Normal range of motion.     Cervical back: Normal range of motion and neck supple.  Skin:    General: Skin is warm and dry.     Capillary Refill: Capillary refill takes less than 2 seconds.  Neurological:     General: No focal deficit present.     Mental Status: He is alert and oriented to person, place, and time.  Psychiatric:        Mood and Affect: Mood normal.        Behavior: Behavior normal.     ASSESSMENT & PLAN: Starling was seen today for ulcer.  Diagnoses and all orders for this visit:  Epigastric pain  Acute gastritis without hemorrhage, unspecified gastritis type -     pantoprazole (PROTONIX) 40 MG tablet; Take 1 tablet (40 mg total) by mouth daily. -     famotidine (PEPCID) 40 MG tablet; Take 1 tablet (40 mg total) by mouth at bedtime for 14 days.  Dyspepsia  Clinically stable.  No red flag signs or symptoms.  Differential diagnosis of epigastric pain discussed with patient including possibilities of peptic ulcer disease or acute gastritis.  Will  start PPI therapy and H2 blocker for maximum control of acid production.  Diet and foods to avoid discussed with patient.  Advised no EtOH intake of any kind.  ED precautions given.  Advised to look for melena or blood in the stools.  Advised to contact the office if no better or worse during the next several days.  Patient Instructions  Gastritis, Adult Gastritis is swelling (inflammation) of the stomach. Gastritis can develop quickly (acute). It can also develop slowly over time (chronic). It is important to get help for this condition. If you do not get help, your stomach can bleed, and you can get sores (  ulcers) in your stomach. What are the causes? This condition may be caused by: Germs that get to your stomach. Drinking too much alcohol. Medicines you are taking. Too much acid in the stomach. A disease of the intestines or stomach. Stress. An allergic reaction. Crohn's disease. Some cancer treatments (radiation). Sometimes the cause of this condition is not known. What are the signs or symptoms? Symptoms of this condition include: Pain in your stomach. A burning feeling in your stomach. Feeling sick to your stomach (nauseous). Throwing up (vomiting). Feeling too full after you eat. Weight loss. Bad breath. Throwing up blood. Blood in your poop (stool). How is this diagnosed? This condition may be diagnosed with: Your medical history and symptoms. A physical exam. Tests. These can include: Blood tests. Stool tests. A procedure to look inside your stomach (upper endoscopy). A test in which a sample of tissue is taken for testing (biopsy). How is this treated? Treatment for this condition depends on what caused it. You may be given: Antibiotic medicine, if your condition was caused by germs. H2 blockers and similar medicines, if your condition was caused by too much acid. Follow these instructions at home: Medicines Take over-the-counter and prescription medicines only  as told by your doctor. If you were prescribed an antibiotic medicine, take it as told by your doctor. Do not stop taking it even if you start to feel better. Eating and drinking  Eat small meals often, instead of large meals. Avoid foods and drinks that make your symptoms worse. Drink enough fluid to keep your pee (urine) pale yellow. Alcohol use Do not drink alcohol if: Your doctor tells you not to drink. You are pregnant, may be pregnant, or are planning to become pregnant. If you drink alcohol: Limit your use to: 0-1 drink a day for women. 0-2 drinks a day for men. Be aware of how much alcohol is in your drink. In the [place] one drink equals one 12 oz bottle of beer (355 mL), one 5 oz glass of wine (148 mL), or one 1 oz glass of hard liquor (44 mL). General instructions Talk with your doctor about ways to manage stress. You can exercise or do deep breathing, meditation, or yoga. Do not smoke or use products that have nicotine or tobacco. If you need help quitting, ask your doctor. Keep all follow-up visits as told by your doctor. This is important. Contact a doctor if: Your symptoms get worse. Your symptoms go away and then come back. Get help right away if: You throw up blood or something that looks like coffee grounds. You have black or dark red poop. You throw up any time you try to drink fluids. Your stomach pain gets worse. You have a fever. You do not feel better after one week. Summary Gastritis is swelling (inflammation) of the stomach. You must get help for this condition. If you do not get help, your stomach can bleed, and you can get sores (ulcers). This condition is diagnosed with medical history, physical exam, or tests. You can be treated with medicines for germs or medicines to block too much acid in your stomach. This information is not intended to replace advice given to you by your health care provider. Make sure you discuss any questions you have with your  health care provider. Document Revised: 07/14/2017 Document Reviewed: 07/14/2017 Elsevier Patient Education  2022 Elsevier Inc.    Edwina Barth, MD [facility]

## 2021-07-25 ENCOUNTER — Other Ambulatory Visit: Payer: Self-pay | Admitting: Emergency Medicine

## 2021-07-25 DIAGNOSIS — K2900 Acute gastritis without bleeding: Secondary | ICD-10-CM

## 2021-07-25 MED ORDER — PANTOPRAZOLE SODIUM 40 MG PO TBEC: 40.0000 mg | DELAYED_RELEASE_TABLET | Freq: Every day | ORAL | 1 refills | Status: DC

## 2021-07-25 MED ORDER — FAMOTIDINE 40 MG PO TABS: 40.0000 mg | ORAL_TABLET | Freq: Every day | ORAL | 1 refills | Status: DC

## 2021-10-03 ENCOUNTER — Telehealth: Payer: Self-pay

## 2021-10-03 MED ORDER — ATOVAQUONE-PROGUANIL HCL 250-100 MG PO TABS: 1.0000 | ORAL_TABLET | Freq: Every day | ORAL | 0 refills | Status: DC

## 2021-10-03 NOTE — Addendum Note (Signed)
Addended by: Pincus Sanes on: 10/03/2021 04:25 PM   Modules accepted: Orders

## 2021-10-03 NOTE — Telephone Encounter (Signed)
PCP is out f the office and Pt states he will be going to visit [place] on 10/06/2021 and is needing a RX for Malaria.  Please send to [facility] on [address].

## 2021-10-03 NOTE — Telephone Encounter (Signed)
Looks like he has been to [place] in the past-was on Malarone last year.  Is he going to the same place-we can do the same medication-how long was he going?

## 2022-01-13 ENCOUNTER — Telehealth: Payer: Self-pay | Admitting: Emergency Medicine

## 2022-01-13 NOTE — Telephone Encounter (Signed)
Pt advised to go to the emergency department and he disconnected the call when connection to [facility] triage.

## 2022-01-13 NOTE — Telephone Encounter (Signed)
Pt called to report SHARP Chest pain approximately 3 days. Transferred to triage  [facility] [phone]

## 2022-01-13 NOTE — Telephone Encounter (Signed)
Thank you for the info.

## 2022-01-15 ENCOUNTER — Ambulatory Visit (INDEPENDENT_AMBULATORY_CARE_PROVIDER_SITE_OTHER): Payer: Managed Care, Other (non HMO) | Admitting: Emergency Medicine

## 2022-01-15 ENCOUNTER — Encounter: Payer: Self-pay | Admitting: Emergency Medicine

## 2022-01-15 ENCOUNTER — Ambulatory Visit (INDEPENDENT_AMBULATORY_CARE_PROVIDER_SITE_OTHER): Payer: Managed Care, Other (non HMO)

## 2022-01-15 VITALS — BP 104/68 | HR 71 | Temp 98.4°F | Ht 64.0 in | Wt 149.0 lb

## 2022-01-15 DIAGNOSIS — Z0000 Encounter for general adult medical examination without abnormal findings: Secondary | ICD-10-CM

## 2022-01-15 DIAGNOSIS — K279 Peptic ulcer, site unspecified, unspecified as acute or chronic, without hemorrhage or perforation: Secondary | ICD-10-CM

## 2022-01-15 DIAGNOSIS — Z1159 Encounter for screening for other viral diseases: Secondary | ICD-10-CM | POA: Diagnosis not present

## 2022-01-15 DIAGNOSIS — Z13220 Encounter for screening for lipoid disorders: Secondary | ICD-10-CM | POA: Diagnosis not present

## 2022-01-15 DIAGNOSIS — Z130 Encounter for screening for diseases of the blood and blood-forming organs and certain disorders involving the immune mechanism: Secondary | ICD-10-CM

## 2022-01-15 DIAGNOSIS — Z8711 Personal history of peptic ulcer disease: Secondary | ICD-10-CM | POA: Diagnosis not present

## 2022-01-15 DIAGNOSIS — R079 Chest pain, unspecified: Secondary | ICD-10-CM | POA: Diagnosis not present

## 2022-01-15 DIAGNOSIS — Z1329 Encounter for screening for other suspected endocrine disorder: Secondary | ICD-10-CM | POA: Diagnosis not present

## 2022-01-15 DIAGNOSIS — Z13228 Encounter for screening for other metabolic disorders: Secondary | ICD-10-CM

## 2022-01-15 LAB — CBC WITH DIFFERENTIAL/PLATELET
Basophils Absolute: 0 10*3/uL (ref 0.0–0.1)
Basophils Relative: 0.3 % (ref 0.0–3.0)
Eosinophils Absolute: 0.1 10*3/uL (ref 0.0–0.7)
Eosinophils Relative: 2.7 % (ref 0.0–5.0)
HCT: 40.1 % (ref 39.0–52.0)
Hemoglobin: 13.1 g/dL (ref 13.0–17.0)
Lymphocytes Relative: 45 % (ref 12.0–46.0)
Lymphs Abs: 1.7 10*3/uL (ref 0.7–4.0)
MCHC: 32.6 g/dL (ref 30.0–36.0)
MCV: 79.7 fl (ref 78.0–100.0)
Monocytes Absolute: 0.5 10*3/uL (ref 0.1–1.0)
Monocytes Relative: 12.9 % — ABNORMAL HIGH (ref 3.0–12.0)
Neutro Abs: 1.4 10*3/uL (ref 1.4–7.7)
Neutrophils Relative %: 39.1 % — ABNORMAL LOW (ref 43.0–77.0)
Platelets: 198 10*3/uL (ref 150.0–400.0)
RBC: 5.03 Mil/uL (ref 4.22–5.81)
RDW: 13 % (ref 11.5–15.5)
WBC: 3.7 10*3/uL — ABNORMAL LOW (ref 4.0–10.5)

## 2022-01-15 LAB — COMPREHENSIVE METABOLIC PANEL
ALT: 25 U/L (ref 0–53)
AST: 23 U/L (ref 0–37)
Albumin: 4 g/dL (ref 3.5–5.2)
Alkaline Phosphatase: 44 U/L (ref 39–117)
BUN: 13 mg/dL (ref 6–23)
CO2: 30 mEq/L (ref 19–32)
Calcium: 9.3 mg/dL (ref 8.4–10.5)
Chloride: 103 mEq/L (ref 96–112)
Creatinine, Ser: 1.04 mg/dL (ref 0.40–1.50)
GFR: 91.09 mL/min (ref >60.00)
Glucose, Bld: 79 mg/dL (ref 70–99)
Potassium: 3.8 mEq/L (ref 3.5–5.1)
Sodium: 137 mEq/L (ref 135–145)
Total Bilirubin: 0.7 mg/dL (ref 0.2–1.2)
Total Protein: 7.5 g/dL (ref 6.0–8.3)

## 2022-01-15 LAB — HEMOGLOBIN A1C: Hgb A1c MFr Bld: 5.4 % (ref 4.6–6.5)

## 2022-01-15 LAB — LIPID PANEL
Cholesterol: 174 mg/dL (ref 0–200)
HDL: 42.9 mg/dL (ref >39.00)
LDL Cholesterol: 110 mg/dL — ABNORMAL HIGH (ref 0–99)
NonHDL: 131.54
Total CHOL/HDL Ratio: 4
Triglycerides: 110 mg/dL (ref 0.0–149.0)
VLDL: 22 mg/dL (ref 0.0–40.0)

## 2022-01-15 MED ORDER — PANTOPRAZOLE SODIUM 40 MG PO TBEC: 40.0000 mg | DELAYED_RELEASE_TABLET | Freq: Every day | ORAL | 1 refills | Status: DC

## 2022-01-15 NOTE — Progress Notes (Signed)
Samuel Byrd 36 y.o.   Chief Complaint  Patient presents with   Annual Exam   Chest Pain    Off and on chest pain, left side     HISTORY OF PRESENT ILLNESS: This is a 36 y.o. male here for annual exam. Also concerned about episodes of left-sided chest pain for the past couple weeks.  Episodes are brief lasting several seconds.  Sharp pain without radiation or associated symptoms.  Non-smoker.  No significant past medical history. No other complaints or medical concerns today.  Chest Pain  Pertinent negatives include no abdominal pain, cough, dizziness, fever, headaches, nausea, shortness of breath or vomiting.     Prior to Admission medications   Medication Sig Start Date End Date Taking? Authorizing Provider  atovaquone-proguanil (MALARONE) 250-100 MG TABS tablet Take 1 tablet by mouth daily. Start 1 to 2 days before traveling, daily while in [place], and continue daily for 7 days after returning. 10/03/21  Yes Burns, Bobette Mo, MD  famotidine (PEPCID) 40 MG tablet Take 1 tablet (40 mg total) by mouth at bedtime for 14 days. 07/25/21 08/08/21  Georgina Quint, MD  pantoprazole (PROTONIX) 40 MG tablet Take 1 tablet (40 mg total) by mouth daily. 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   History of peptic ulcer disease 11/29/2018   Gynecomastia 04/28/2017   PUD (peptic ulcer disease) 10/09/2016    Past Medical History:  Diagnosis Date   Gynecomastia 12/2017   right    Past Surgical History:  Procedure Laterality Date   ESOPHAGOGASTRODUODENOSCOPY N/A 07/23/2015   Procedure: ESOPHAGOGASTRODUODENOSCOPY (EGD);  Surgeon: Sherrilyn Rist, MD;  Location: [facility];  Service: Endoscopy;  Laterality: N/A;   GYNECOMASTIA MASTECTOMY Right 12/23/2017   Procedure: RIGHT SUBCUTANEOUS MASTECTOMY  GYNECOMASTIA;  Surgeon: Manus Rudd, MD;  Location: [facility];  Service: General;  Laterality: Right;    Social History   Socioeconomic History   Marital status: Married    Spouse name: Not on file   Number of children: 0   Years of education: Not on file   Highest education level: Not on file  Occupational History   Occupation: [employer]  Tobacco Use   Smoking status: Former    Types: Cigarettes    Quit date: 01/07/2017    Years since quitting: 5.0   Smokeless tobacco: Never  Vaping Use   Vaping Use: Never used  Substance and Sexual Activity   Alcohol use: Yes    Comment: occasionally   Drug use: No   Sexual activity: Not on file  Other Topics Concern   Not on file  Social History Narrative   Not on file   Social Determinants of Health   Financial Resource Strain: Not on file  Food Insecurity: Not on file  Transportation Needs: Not on file  Physical Activity: Not on file  Stress: Not on file  Social Connections: Not on file  Intimate Partner Violence: Not on file    Family History  Problem Relation Age of Onset   Heart attack Mother    Ulcers Brother      Review of Systems  Constitutional: Negative.  Negative for chills and fever.  HENT: Negative.  Negative for congestion and sore throat.   Respiratory: Negative.  Negative for cough and shortness of breath.  Cardiovascular:  Positive for chest pain.  Gastrointestinal: Negative.  Negative for abdominal pain, nausea and vomiting.  Genitourinary: Negative.  Negative for dysuria and hematuria.  Musculoskeletal: Negative.   Skin: Negative.  Negative for rash.  Neurological: Negative.  Negative for dizziness and headaches.  All other systems reviewed and are negative.   Today's Vitals   01/15/22 1410  BP: 104/68  Pulse: 71  Temp: 98.4 F (36.9 C)  TempSrc: Oral  SpO2: 96%  Weight: 149 lb (67.6 kg)  Height: 5\' 4"  (1.626 m)   Body mass index is 25.58 kg/m. Wt Readings  from Last 3 Encounters:  01/15/22 149 lb (67.6 kg)  11/19/20 137 lb (62.1 kg)  05/16/20 137 lb (62.1 kg)    Physical Exam Constitutional:      Appearance: Normal appearance.  HENT:     Head: Normocephalic.     Right Ear: Tympanic membrane, ear canal and external ear normal.     Left Ear: Tympanic membrane, ear canal and external ear normal.     Mouth/Throat:     Mouth: Mucous membranes are moist.     Pharynx: Oropharynx is clear.  Eyes:     Extraocular Movements: Extraocular movements intact.     Conjunctiva/sclera: Conjunctivae normal.     Pupils: Pupils are equal, round, and reactive to light.  Cardiovascular:     Rate and Rhythm: Normal rate and regular rhythm.     Pulses: Normal pulses.     Heart sounds: Normal heart sounds.  Pulmonary:     Effort: Pulmonary effort is normal.     Breath sounds: Normal breath sounds.  Chest:     Chest wall: No tenderness.  Abdominal:     Palpations: Abdomen is soft.     Tenderness: There is no abdominal tenderness.  Musculoskeletal:     Cervical back: No tenderness.     Right lower leg: No edema.     Left lower leg: No edema.  Lymphadenopathy:     Cervical: No cervical adenopathy.  Skin:    General: Skin is warm and dry.  Neurological:     General: No focal deficit present.     Mental Status: He is alert and oriented to person, place, and time.  Psychiatric:        Mood and Affect: Mood normal.        Behavior: Behavior normal.   DG Chest 2 View  Result Date: 01/15/2022 CLINICAL DATA:  Left-sided chest pain over the last several days. EXAM: CHEST - 2 VIEW COMPARISON:  04/21/2017 FINDINGS: Heart size is normal. Mediastinal shadows are normal. The lungs are clear. No bronchial thickening. No infiltrate, mass, effusion or collapse. Pulmonary vascularity is normal. No bony abnormality. IMPRESSION: Normal chest. Electronically Signed   By: 06/19/2017 M.D.   On: 01/15/2022 15:04     EKG: Normal sinus rhythm with a ventricular rate  of 68.  No acute ischemic changes.  Normal EKG.  ASSESSMENT & PLAN: Problem List Items Addressed This Visit       Digestive   PUD (peptic ulcer disease)    Stable and asymptomatic.  Takes Protonix 40 mg daily as needed        Other   History of peptic ulcer disease   Relevant Medications   pantoprazole (PROTONIX) 40 MG tablet   Nonspecific chest pain    Clinically stable.  Stable vital signs. No red flag signs or symptoms. No significant cardiac history. Normal EKG and normal chest x-ray. Differential  diagnosis discussed. Doubt cardiac etiology.      Relevant Orders   EKG 12-Lead   DG Chest 2 View   Other Visit Diagnoses     Routine general medical examination at a health care facility    -  Primary   Need for hepatitis C screening test       Relevant Orders   Hepatitis C antibody screen   Screening for deficiency anemia       Relevant Orders   CBC with Differential   Screening for lipoid disorders       Relevant Orders   Lipid panel   Screening for endocrine, metabolic and immunity disorder       Relevant Orders   Comprehensive metabolic panel   Hemoglobin A1c      Modifiable risk factors discussed with patient. Anticipatory guidance according to age provided. The following topics were also discussed: Social Determinants of Health Smoking.  Non-smoker Diet and nutrition.  Good eating habits Benefits of exercise.  Exercises regularly Cancer family history review Vaccinations reviewed and recommendations Cardiovascular risk assessment and need for blood work Review of EKG and chest x-ray Mental health including depression and anxiety Fall and accident prevention  Patient Instructions  Health Maintenance, Male Adopting a healthy lifestyle and getting preventive care are important in promoting health and wellness. Ask your health care provider about: The right schedule for you to have regular tests and exams. Things you can do on your own to prevent  diseases and keep yourself healthy. What should I know about diet, weight, and exercise? Eat a healthy diet  Eat a diet that includes plenty of vegetables, fruits, low-fat dairy products, and lean protein. Do not eat a lot of foods that are high in solid fats, added sugars, or sodium. Maintain a healthy weight Body mass index (BMI) is a measurement that can be used to identify possible weight problems. It estimates body fat based on height and weight. Your health care provider can help determine your BMI and help you achieve or maintain a healthy weight. Get regular exercise Get regular exercise. This is one of the most important things you can do for your health. Most adults should: Exercise for at least 150 minutes each week. The exercise should increase your heart rate and make you sweat (moderate-intensity exercise). Do strengthening exercises at least twice a week. This is in addition to the moderate-intensity exercise. Spend less time sitting. Even light physical activity can be beneficial. Watch cholesterol and blood lipids Have your blood tested for lipids and cholesterol at 38 years of age, then have this test every 5 years. You may need to have your cholesterol levels checked more often if: Your lipid or cholesterol levels are high. You are older than 38 years of age. You are at high risk for heart disease. What should I know about cancer screening? Many types of cancers can be detected early and may often be prevented. Depending on your health history and family history, you may need to have cancer screening at various ages. This may include screening for: Colorectal cancer. Prostate cancer. Skin cancer. Lung cancer. What should I know about heart disease, diabetes, and high blood pressure? Blood pressure and heart disease High blood pressure causes heart disease and increases the risk of stroke. This is more likely to develop in people who have high blood pressure readings or  are overweight. Talk with your health care provider about your target blood pressure readings. Have your blood pressure checked: Every  3-5 years if you are 47-63 years of age. Every year if you are 9 years old or older. If you are between the ages of 75 and 89 and are a current or former smoker, ask your health care provider if you should have a one-time screening for abdominal aortic aneurysm (AAA). Diabetes Have regular diabetes screenings. This checks your fasting blood sugar level. Have the screening done: Once every three years after age 76 if you are at a normal weight and have a low risk for diabetes. More often and at a younger age if you are overweight or have a high risk for diabetes. What should I know about preventing infection? Hepatitis B If you have a higher risk for hepatitis B, you should be screened for this virus. Talk with your health care provider to find out if you are at risk for hepatitis B infection. Hepatitis C Blood testing is recommended for: Everyone born from 1 through 1965. Anyone with known risk factors for hepatitis C. Sexually transmitted infections (STIs) You should be screened each year for STIs, including gonorrhea and chlamydia, if: You are sexually active and are younger than 38 years of age. You are older than 38 years of age and your health care provider tells you that you are at risk for this type of infection. Your sexual activity has changed since you were last screened, and you are at increased risk for chlamydia or gonorrhea. Ask your health care provider if you are at risk. Ask your health care provider about whether you are at high risk for HIV. Your health care provider may recommend a prescription medicine to help prevent HIV infection. If you choose to take medicine to prevent HIV, you should first get tested for HIV. You should then be tested every 3 months for as long as you are taking the medicine. Follow these instructions at  home: Alcohol use Do not drink alcohol if your health care provider tells you not to drink. If you drink alcohol: Limit how much you have to 0-2 drinks a day. Know how much alcohol is in your drink. In the [place] one drink equals one 12 oz bottle of beer (355 mL), one 5 oz glass of wine (148 mL), or one 1 oz glass of hard liquor (44 mL). Lifestyle Do not use any products that contain nicotine or tobacco. These products include cigarettes, chewing tobacco, and vaping devices, such as e-cigarettes. If you need help quitting, ask your health care provider. Do not use street drugs. Do not share needles. Ask your health care provider for help if you need support or information about quitting drugs. General instructions Schedule regular health, dental, and eye exams. Stay current with your vaccines. Tell your health care provider if: You often feel depressed. You have ever been abused or do not feel safe at home. Summary Adopting a healthy lifestyle and getting preventive care are important in promoting health and wellness. Follow your health care provider's instructions about healthy diet, exercising, and getting tested or screened for diseases. Follow your health care provider's instructions on monitoring your cholesterol and blood pressure. This information is not intended to replace advice given to you by your health care provider. Make sure you discuss any questions you have with your health care provider. Document Revised: 07/16/2020 Document Reviewed: 07/16/2020 Elsevier Patient Education  2023 Elsevier Inc.     Edwina Barth, MD  Primary Care at [facility]

## 2022-01-15 NOTE — Assessment & Plan Note (Signed)
Clinically stable.  Stable vital signs. No red flag signs or symptoms. No significant cardiac history. Normal EKG and normal chest x-ray. Differential diagnosis discussed. Doubt cardiac etiology.

## 2022-01-15 NOTE — Assessment & Plan Note (Signed)
Stable and asymptomatic.  Takes Protonix 40 mg daily as needed

## 2022-01-15 NOTE — Patient Instructions (Signed)
Health Maintenance, Male Adopting a healthy lifestyle and getting preventive care are important in promoting health and wellness. Ask your health care provider about: The right schedule for you to have regular tests and exams. Things you can do on your own to prevent diseases and keep yourself healthy. What should I know about diet, weight, and exercise? Eat a healthy diet  Eat a diet that includes plenty of vegetables, fruits, low-fat dairy products, and lean protein. Do not eat a lot of foods that are high in solid fats, added sugars, or sodium. Maintain a healthy weight Body mass index (BMI) is a measurement that can be used to identify possible weight problems. It estimates body fat based on height and weight. Your health care provider can help determine your BMI and help you achieve or maintain a healthy weight. Get regular exercise Get regular exercise. This is one of the most important things you can do for your health. Most adults should: Exercise for at least 150 minutes each week. The exercise should increase your heart rate and make you sweat (moderate-intensity exercise). Do strengthening exercises at least twice a week. This is in addition to the moderate-intensity exercise. Spend less time sitting. Even light physical activity can be beneficial. Watch cholesterol and blood lipids Have your blood tested for lipids and cholesterol at 38 years of age, then have this test every 5 years. You may need to have your cholesterol levels checked more often if: Your lipid or cholesterol levels are high. You are older than 38 years of age. You are at high risk for heart disease. What should I know about cancer screening? Many types of cancers can be detected early and may often be prevented. Depending on your health history and family history, you may need to have cancer screening at various ages. This may include screening for: Colorectal cancer. Prostate cancer. Skin cancer. Lung  cancer. What should I know about heart disease, diabetes, and high blood pressure? Blood pressure and heart disease High blood pressure causes heart disease and increases the risk of stroke. This is more likely to develop in people who have high blood pressure readings or are overweight. Talk with your health care provider about your target blood pressure readings. Have your blood pressure checked: Every 3-5 years if you are 18-39 years of age. Every year if you are 40 years old or older. If you are between the ages of 65 and 75 and are a current or former smoker, ask your health care provider if you should have a one-time screening for abdominal aortic aneurysm (AAA). Diabetes Have regular diabetes screenings. This checks your fasting blood sugar level. Have the screening done: Once every three years after age 45 if you are at a normal weight and have a low risk for diabetes. More often and at a younger age if you are overweight or have a high risk for diabetes. What should I know about preventing infection? Hepatitis B If you have a higher risk for hepatitis B, you should be screened for this virus. Talk with your health care provider to find out if you are at risk for hepatitis B infection. Hepatitis C Blood testing is recommended for: Everyone born from 1945 through 1965. Anyone with known risk factors for hepatitis C. Sexually transmitted infections (STIs) You should be screened each year for STIs, including gonorrhea and chlamydia, if: You are sexually active and are younger than 38 years of age. You are older than 38 years of age and your   health care provider tells you that you are at risk for this type of infection. Your sexual activity has changed since you were last screened, and you are at increased risk for chlamydia or gonorrhea. Ask your health care provider if you are at risk. Ask your health care provider about whether you are at high risk for HIV. Your health care provider  may recommend a prescription medicine to help prevent HIV infection. If you choose to take medicine to prevent HIV, you should first get tested for HIV. You should then be tested every 3 months for as long as you are taking the medicine. Follow these instructions at home: Alcohol use Do not drink alcohol if your health care provider tells you not to drink. If you drink alcohol: Limit how much you have to 0-2 drinks a day. Know how much alcohol is in your drink. In the [place] one drink equals one 12 oz bottle of beer (355 mL), one 5 oz glass of wine (148 mL), or one 1 oz glass of hard liquor (44 mL). Lifestyle Do not use any products that contain nicotine or tobacco. These products include cigarettes, chewing tobacco, and vaping devices, such as e-cigarettes. If you need help quitting, ask your health care provider. Do not use street drugs. Do not share needles. Ask your health care provider for help if you need support or information about quitting drugs. General instructions Schedule regular health, dental, and eye exams. Stay current with your vaccines. Tell your health care provider if: You often feel depressed. You have ever been abused or do not feel safe at home. Summary Adopting a healthy lifestyle and getting preventive care are important in promoting health and wellness. Follow your health care provider's instructions about healthy diet, exercising, and getting tested or screened for diseases. Follow your health care provider's instructions on monitoring your cholesterol and blood pressure. This information is not intended to replace advice given to you by your health care provider. Make sure you discuss any questions you have with your health care provider. Document Revised: 07/16/2020 Document Reviewed: 07/16/2020 Elsevier Patient Education  2023 Elsevier Inc.  

## 2022-01-16 LAB — HEPATITIS C ANTIBODY: Hepatitis C Ab: NONREACTIVE

## 2022-04-21 ENCOUNTER — Other Ambulatory Visit: Payer: Self-pay | Admitting: Internal Medicine

## 2022-04-21 MED ORDER — ATOVAQUONE-PROGUANIL HCL 250-100 MG PO TABS: 1.0000 | ORAL_TABLET | Freq: Every day | ORAL | 0 refills | Status: DC

## 2022-06-22 ENCOUNTER — Other Ambulatory Visit: Payer: Self-pay | Admitting: Emergency Medicine

## 2022-06-22 DIAGNOSIS — K2900 Acute gastritis without bleeding: Secondary | ICD-10-CM

## 2022-06-23 MED ORDER — FAMOTIDINE 40 MG PO TABS
40.0000 mg | ORAL_TABLET | Freq: Every day | ORAL | 1 refills | Status: DC
Start: 2022-06-23 — End: 2022-07-02

## 2022-07-02 ENCOUNTER — Other Ambulatory Visit: Payer: Self-pay | Admitting: Emergency Medicine

## 2022-07-02 DIAGNOSIS — K2900 Acute gastritis without bleeding: Secondary | ICD-10-CM

## 2022-08-02 ENCOUNTER — Other Ambulatory Visit: Payer: Self-pay | Admitting: Emergency Medicine

## 2022-08-28 ENCOUNTER — Other Ambulatory Visit: Payer: Self-pay | Admitting: Emergency Medicine

## 2022-08-28 DIAGNOSIS — Z8711 Personal history of peptic ulcer disease: Secondary | ICD-10-CM

## 2022-09-22 ENCOUNTER — Other Ambulatory Visit: Payer: Self-pay | Admitting: Emergency Medicine

## 2022-09-22 DIAGNOSIS — Z8711 Personal history of peptic ulcer disease: Secondary | ICD-10-CM

## 2022-10-23 ENCOUNTER — Telehealth: Payer: Self-pay | Admitting: Emergency Medicine

## 2022-10-23 NOTE — Telephone Encounter (Signed)
Responded to [employer] today from the pt siting "Having pain on my left chest to the back" as his reason for visit.  I called to follow up and he stated the pain is sharp but ' comes and goes', for about 2 weeks. Also states sometimes his L arm hurts and his hands go numb. I advised him to seek care at the ED immediatly. Pt declined and asked for an appointment next week, which was made for 8.22.24.  I spoke with Sagardia's CMA afterwards so his care team would be aware.

## 2022-10-23 NOTE — Telephone Encounter (Signed)
Agree with recommendation.  Will see him next week then.  Thanks.

## 2022-10-30 ENCOUNTER — Ambulatory Visit: Payer: Managed Care, Other (non HMO)

## 2022-10-30 ENCOUNTER — Ambulatory Visit (INDEPENDENT_AMBULATORY_CARE_PROVIDER_SITE_OTHER): Payer: Managed Care, Other (non HMO) | Admitting: Emergency Medicine

## 2022-10-30 ENCOUNTER — Encounter: Payer: Self-pay | Admitting: Emergency Medicine

## 2022-10-30 VITALS — BP 132/72 | HR 66 | Temp 98.2°F | Ht 64.0 in | Wt 146.0 lb

## 2022-10-30 DIAGNOSIS — R079 Chest pain, unspecified: Secondary | ICD-10-CM

## 2022-10-30 LAB — COMPREHENSIVE METABOLIC PANEL
ALT: 17 U/L (ref 0–53)
AST: 20 U/L (ref 0–37)
Albumin: 4.2 g/dL (ref 3.5–5.2)
Alkaline Phosphatase: 45 U/L (ref 39–117)
BUN: 14 mg/dL (ref 6–23)
CO2: 30 mEq/L (ref 19–32)
Calcium: 9.5 mg/dL (ref 8.4–10.5)
Chloride: 102 mEq/L (ref 96–112)
Creatinine, Ser: 0.99 mg/dL (ref 0.40–1.50)
GFR: 96.11 mL/min (ref >60.00)
Glucose, Bld: 87 mg/dL (ref 70–99)
Potassium: 3.6 mEq/L (ref 3.5–5.1)
Sodium: 137 mEq/L (ref 135–145)
Total Bilirubin: 1.6 mg/dL — ABNORMAL HIGH (ref 0.2–1.2)
Total Protein: 7.6 g/dL (ref 6.0–8.3)

## 2022-10-30 LAB — CBC WITH DIFFERENTIAL/PLATELET
Basophils Absolute: 0 10*3/uL (ref 0.0–0.1)
Basophils Relative: 0.6 % (ref 0.0–3.0)
Eosinophils Absolute: 0.1 10*3/uL (ref 0.0–0.7)
Eosinophils Relative: 3.3 % (ref 0.0–5.0)
HCT: 41 % (ref 39.0–52.0)
Hemoglobin: 13.4 g/dL (ref 13.0–17.0)
Lymphocytes Relative: 50.4 % — ABNORMAL HIGH (ref 12.0–46.0)
Lymphs Abs: 1.4 10*3/uL (ref 0.7–4.0)
MCHC: 32.6 g/dL (ref 30.0–36.0)
MCV: 78.8 fl (ref 78.0–100.0)
Monocytes Absolute: 0.3 10*3/uL (ref 0.1–1.0)
Monocytes Relative: 11.9 % (ref 3.0–12.0)
Neutro Abs: 0.9 10*3/uL — ABNORMAL LOW (ref 1.4–7.7)
Neutrophils Relative %: 33.8 % — ABNORMAL LOW (ref 43.0–77.0)
Platelets: 179 10*3/uL (ref 150.0–400.0)
RBC: 5.2 Mil/uL (ref 4.22–5.81)
RDW: 12.7 % (ref 11.5–15.5)
WBC: 2.8 10*3/uL — ABNORMAL LOW (ref 4.0–10.5)

## 2022-10-30 LAB — LIPID PANEL
Cholesterol: 205 mg/dL — ABNORMAL HIGH (ref 0–200)
HDL: 37.7 mg/dL — ABNORMAL LOW (ref >39.00)
LDL Cholesterol: 142 mg/dL — ABNORMAL HIGH (ref 0–99)
NonHDL: 167.64
Total CHOL/HDL Ratio: 5
Triglycerides: 129 mg/dL (ref 0.0–149.0)
VLDL: 25.8 mg/dL (ref 0.0–40.0)

## 2022-10-30 LAB — HEMOGLOBIN A1C: Hgb A1c MFr Bld: 5.7 % (ref 4.6–6.5)

## 2022-10-30 NOTE — Assessment & Plan Note (Signed)
Clinically stable.  No red flag signs or symptoms. No history of cardiac condition. Stable vital signs.  Benign physical examination. Normal EKG. Chest x-ray done today.  Report reviewed. Blood work done today. Differential diagnosis discussed.  Doubt cardiac etiology. ED precautions given May need cardiology referral.

## 2022-10-30 NOTE — Patient Instructions (Signed)
Nonspecific Chest Pain Chest pain can be caused by many different conditions. Some causes of chest pain can be life-threatening. These will require treatment right away. Serious causes of chest pain include: Heart attack. A tear in the body's main blood vessel. Redness and swelling (inflammation) around your heart. Blood clot in your lungs. Other causes of chest pain may not be so serious. These include: Heartburn. Anxiety or stress. Damage to bones or muscles in your chest. Lung infections. Chest pain can feel like: Pain or discomfort in your chest. Crushing, pressure, aching, or squeezing pain. Burning or tingling. Dull or sharp pain that is worse when you move, cough, or take a deep breath. Pain or discomfort that is also felt in your back, neck, jaw, shoulder, or arm, or pain that spreads to any of these areas. It is hard to know whether your pain is caused by something that is serious or something that is not so serious. So it is important to see your doctor right away if you have chest pain. Follow these instructions at home: Medicines Take over-the-counter and prescription medicines only as told by your doctor. If you were prescribed an antibiotic medicine, take it as told by your doctor. Do not stop taking the antibiotic even if you start to feel better. Lifestyle  Rest as told by your doctor. Do not use any products that contain nicotine or tobacco, such as cigarettes, e-cigarettes, and chewing tobacco. If you need help quitting, ask your doctor. Do not drink alcohol. Make lifestyle changes as told by your doctor. These may include: Getting regular exercise. Ask your doctor what activities are safe for you. Eating a heart-healthy diet. A diet and nutrition specialist (dietitian) can help you to learn healthy eating options. Staying at a healthy weight. Treating diabetes or high blood pressure, if needed. Lowering your stress. Activities such as yoga and relaxation techniques  can help. General instructions Pay attention to any changes in your symptoms. Tell your doctor about them or any new symptoms. Avoid any activities that cause chest pain. Keep all follow-up visits as told by your doctor. This is important. You may need more testing if your chest pain does not go away. Contact a doctor if: Your chest pain does not go away. You feel depressed. You have a fever. Get help right away if: Your chest pain is worse. You have a cough that gets worse, or you cough up blood. You have very bad (severe) pain in your belly (abdomen). You pass out (faint). You have either of these for no clear reason: Sudden chest discomfort. Sudden discomfort in your arms, back, neck, or jaw. You have shortness of breath at any time. You suddenly start to sweat, or your skin gets clammy. You feel sick to your stomach (nauseous). You throw up (vomit). You suddenly feel lightheaded or dizzy. You feel very weak or tired. Your heart starts to beat fast, or it feels like it is skipping beats. These symptoms may be an emergency. Do not wait to see if the symptoms will go away. Get medical help right away. Call your local emergency services (911 in the [place] Do not drive yourself to the hospital. Summary Chest pain can be caused by many different conditions. The cause may be serious and need treatment right away. If you have chest pain, see your doctor right away. Follow your doctor's instructions for taking medicines and making lifestyle changes. Keep all follow-up visits as told by your doctor. This includes visits for any further   testing if your chest pain does not go away. Be sure to know the signs that show that your condition has become worse. Get help right away if you have these symptoms. This information is not intended to replace advice given to you by your health care provider. Make sure you discuss any questions you have with your health care provider. Document Revised:  01/09/2022 Document Reviewed: 01/09/2022 Elsevier Patient Education  2024 Elsevier Inc.  

## 2022-10-30 NOTE — Progress Notes (Addendum)
Samuel Byrd 37 y.o.   Chief Complaint  Patient presents with   Chest Pain    Chest pain comes and goes x 2 weeks, patient states he had chest pains 4 days ago, heavy feeling, pains in left shoulder     HISTORY OF PRESENT ILLNESS: This is a 37 y.o. male complaining of chest pain that comes and goes for 2 weeks.  Sharp pain across the chest with no radiation and no associated symptoms. No history of heart conditions.  No other complaints or medical concerns today.  Chest Pain  Pertinent negatives include no abdominal pain, cough, dizziness, fever, headaches, nausea, palpitations, shortness of breath or vomiting.     Prior to Admission medications   Medication Sig Start Date End Date Taking? Authorizing Provider  pantoprazole (PROTONIX) 40 MG tablet TAKE 1 TABLET BY MOUTH EVERY DAY 09/22/22  Yes Chantele Corado, Eilleen Kempf, MD  atovaquone-proguanil (MALARONE) 250-100 MG TABS tablet TAKE 1 TABLET BY MOUTH DAILY. START 1 TO 2 DAYS BEFORE TRAVELING, DAILY WHILE IN [place], AND CONTINUE DAILY FOR 7 DAYS AFTER RETURNING. Patient not taking: Reported on 10/30/2022 08/02/22   Georgina Quint, MD  famotidine (PEPCID) 40 MG tablet Take 1 tablet (40 mg total) by mouth at bedtime. 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   History of peptic ulcer disease 11/29/2018   Gynecomastia 04/28/2017   PUD (peptic ulcer disease) 10/09/2016    Past Medical History:  Diagnosis Date   Gynecomastia 12/2017   right    Past Surgical History:  Procedure Laterality Date   ESOPHAGOGASTRODUODENOSCOPY N/A 07/23/2015   Procedure: ESOPHAGOGASTRODUODENOSCOPY (EGD);  Surgeon: Sherrilyn Rist, MD;  Location: [facility];  Service: Endoscopy;  Laterality: N/A;    GYNECOMASTIA MASTECTOMY Right 12/23/2017   Procedure: RIGHT SUBCUTANEOUS MASTECTOMY GYNECOMASTIA;  Surgeon: Manus Rudd, MD;  Location: [facility];  Service: General;  Laterality: Right;    Social History   Socioeconomic History   Marital status: Married    Spouse name: Not on file   Number of children: 0   Years of education: Not on file   Highest education level: Not on file  Occupational History   Occupation: [employer]  Tobacco Use   Smoking status: Former    Current packs/day: 0.00    Types: Cigarettes    Quit date: 01/07/2017    Years since quitting: 5.8   Smokeless tobacco: Never  Vaping Use   Vaping status: Never Used  Substance and Sexual Activity   Alcohol use: Yes    Comment: occasionally   Drug use: No   Sexual activity: Not on file  Other Topics Concern   Not on file  Social History Narrative   Not on file   Social Determinants of Health   Financial Resource Strain: Not on file  Food Insecurity: Not on file  Transportation Needs: Not on file  Physical Activity: Not on file  Stress: Not on file  Social Connections: Not on file  Intimate Partner Violence: Not on file    Family History  Problem Relation Age of Onset   Heart attack Mother    Ulcers Brother      Review of Systems  Constitutional: Negative.  Negative for chills and fever.  HENT: Negative.  Negative for congestion and sore throat.   Respiratory: Negative.  Negative for cough and shortness of breath.   Cardiovascular:  Positive for chest pain. Negative for palpitations.  Gastrointestinal:  Negative for abdominal pain, diarrhea, nausea and vomiting.  Genitourinary: Negative.  Negative for dysuria and hematuria.  Skin: Negative.  Negative for rash.  Neurological: Negative.  Negative for dizziness and headaches.  All other systems reviewed and are negative.   Vitals:   10/30/22 1338  BP: 132/72  Pulse: 66  Temp: 98.2 F (36.8 C)  SpO2: 98%     Physical Exam Vitals reviewed.  Constitutional:      Appearance: Normal appearance.  HENT:     Head: Normocephalic.     Mouth/Throat:     Mouth: Mucous membranes are moist.     Pharynx: Oropharynx is clear.  Eyes:     Extraocular Movements: Extraocular movements intact.     Conjunctiva/sclera: Conjunctivae normal.     Pupils: Pupils are equal, round, and reactive to light.  Cardiovascular:     Rate and Rhythm: Normal rate and regular rhythm.     Pulses: Normal pulses.     Heart sounds: Normal heart sounds.  Pulmonary:     Effort: Pulmonary effort is normal.     Breath sounds: Normal breath sounds.  Abdominal:     Palpations: Abdomen is soft.     Tenderness: There is no abdominal tenderness.  Musculoskeletal:     Cervical back: No tenderness.  Lymphadenopathy:     Cervical: No cervical adenopathy.  Skin:    General: Skin is warm and dry.     Capillary Refill: Capillary refill takes less than 2 seconds.  Neurological:     General: No focal deficit present.     Mental Status: He is alert and oriented to person, place, and time.  Psychiatric:        Mood and Affect: Mood normal.        Behavior: Behavior normal.    EKG: Normal sinus rhythm with ventricular rate of 62/min.  No acute ischemic changes.  Normal EKG.  DG Chest 2 View  Result Date: 10/30/2022 CLINICAL DATA:  Chest pain for 2 weeks. EXAM: CHEST - 2 VIEW COMPARISON:  Chest radiographs 01/15/2022, 04/21/2017 FINDINGS: Cardiac silhouette and mediastinal contours are within normal limits. The lungs are clear. No pleural effusion or pneumothorax. Minimal multilevel degenerative disc changes of the thoracic spine. IMPRESSION: No active cardiopulmonary disease. Electronically Signed   By: Neita Garnet M.D.   On: 10/30/2022 14:54    ASSESSMENT & PLAN: A total of 44 minutes was spent with the patient and counseling/coordination of care regarding preparing for this visit, review of most recent office visit notes,  review of most recent blood work results, differential diagnosis of chest pain, review of chest x-ray report done today, ED precautions, prognosis, documentation, and need for follow-up.  Problem List Items Addressed This Visit       Other   Nonspecific chest pain - Primary    Clinically stable.  No red flag signs or symptoms. No history of cardiac condition. Stable vital signs.  Benign physical examination. Normal EKG. Chest x-ray done today.  Report reviewed. Blood work done today. Differential diagnosis discussed.  Doubt cardiac etiology. ED precautions given May need cardiology referral.      Relevant Orders   EKG 12-Lead   CBC with Differential/Platelet   Comprehensive metabolic panel   Hemoglobin A1c   Lipid panel  DG Chest 2 View   Patient Instructions  Nonspecific Chest Pain Chest pain can be caused by many different conditions. Some causes of chest pain can be life-threatening. These will require treatment right away. Serious causes of chest pain include: Heart attack. A tear in the body's main blood vessel. Redness and swelling (inflammation) around your heart. Blood clot in your lungs. Other causes of chest pain may not be so serious. These include: Heartburn. Anxiety or stress. Damage to bones or muscles in your chest. Lung infections. Chest pain can feel like: Pain or discomfort in your chest. Crushing, pressure, aching, or squeezing pain. Burning or tingling. Dull or sharp pain that is worse when you move, cough, or take a deep breath. Pain or discomfort that is also felt in your back, neck, jaw, shoulder, or arm, or pain that spreads to any of these areas. It is hard to know whether your pain is caused by something that is serious or something that is not so serious. So it is important to see your doctor right away if you have chest pain. Follow these instructions at home: Medicines Take over-the-counter and prescription medicines only as told by your  doctor. If you were prescribed an antibiotic medicine, take it as told by your doctor. Do not stop taking the antibiotic even if you start to feel better. Lifestyle  Rest as told by your doctor. Do not use any products that contain nicotine or tobacco, such as cigarettes, e-cigarettes, and chewing tobacco. If you need help quitting, ask your doctor. Do not drink alcohol. Make lifestyle changes as told by your doctor. These may include: Getting regular exercise. Ask your doctor what activities are safe for you. Eating a heart-healthy diet. A diet and nutrition specialist (dietitian) can help you to learn healthy eating options. Staying at a healthy weight. Treating diabetes or high blood pressure, if needed. Lowering your stress. Activities such as yoga and relaxation techniques can help. General instructions Pay attention to any changes in your symptoms. Tell your doctor about them or any new symptoms. Avoid any activities that cause chest pain. Keep all follow-up visits as told by your doctor. This is important. You may need more testing if your chest pain does not go away. Contact a doctor if: Your chest pain does not go away. You feel depressed. You have a fever. Get help right away if: Your chest pain is worse. You have a cough that gets worse, or you cough up blood. You have very bad (severe) pain in your belly (abdomen). You pass out (faint). You have either of these for no clear reason: Sudden chest discomfort. Sudden discomfort in your arms, back, neck, or jaw. You have shortness of breath at any time. You suddenly start to sweat, or your skin gets clammy. You feel sick to your stomach (nauseous). You throw up (vomit). You suddenly feel lightheaded or dizzy. You feel very weak or tired. Your heart starts to beat fast, or it feels like it is skipping beats. These symptoms may be an emergency. Do not wait to see if the symptoms will go away. Get medical help right away. Call  your local emergency services (911 in the [place] Do not drive yourself to the hospital. Summary Chest pain can be caused by many different conditions. The cause may be serious and need treatment right away. If you have chest pain, see your doctor right away. Follow your doctor's instructions for taking medicines and making lifestyle changes. Keep all follow-up visits as  told by your doctor. This includes visits for any further testing if your chest pain does not go away. Be sure to know the signs that show that your condition has become worse. Get help right away if you have these symptoms. This information is not intended to replace advice given to you by your health care provider. Make sure you discuss any questions you have with your health care provider. Document Revised: 01/09/2022 Document Reviewed: 01/09/2022 Elsevier Patient Education  2024 Elsevier Inc.      Edwina Barth, MD [facility]

## 2023-07-04 ENCOUNTER — Other Ambulatory Visit: Payer: Self-pay | Admitting: Emergency Medicine

## 2023-07-06 ENCOUNTER — Other Ambulatory Visit: Payer: Self-pay | Admitting: Emergency Medicine

## 2023-10-26 ENCOUNTER — Ambulatory Visit (INDEPENDENT_AMBULATORY_CARE_PROVIDER_SITE_OTHER): Admitting: Emergency Medicine

## 2023-10-26 ENCOUNTER — Ambulatory Visit: Payer: Self-pay | Admitting: Emergency Medicine

## 2023-10-26 ENCOUNTER — Encounter: Payer: Self-pay | Admitting: Emergency Medicine

## 2023-10-26 VITALS — BP 118/68 | HR 76 | Temp 98.9°F | Ht 64.0 in | Wt 148.0 lb

## 2023-10-26 DIAGNOSIS — Z13228 Encounter for screening for other metabolic disorders: Secondary | ICD-10-CM

## 2023-10-26 DIAGNOSIS — Z0000 Encounter for general adult medical examination without abnormal findings: Secondary | ICD-10-CM | POA: Diagnosis not present

## 2023-10-26 DIAGNOSIS — Z1329 Encounter for screening for other suspected endocrine disorder: Secondary | ICD-10-CM

## 2023-10-26 DIAGNOSIS — Z13220 Encounter for screening for lipoid disorders: Secondary | ICD-10-CM

## 2023-10-26 DIAGNOSIS — Z130 Encounter for screening for diseases of the blood and blood-forming organs and certain disorders involving the immune mechanism: Secondary | ICD-10-CM

## 2023-10-26 LAB — LIPID PANEL
Cholesterol: 194 mg/dL (ref 0–200)
HDL: 36.3 mg/dL — ABNORMAL LOW (ref >39.00)
LDL Cholesterol: 142 mg/dL — ABNORMAL HIGH (ref 0–99)
NonHDL: 158.14
Total CHOL/HDL Ratio: 5
Triglycerides: 79 mg/dL (ref 0.0–149.0)
VLDL: 15.8 mg/dL (ref 0.0–40.0)

## 2023-10-26 LAB — URINALYSIS
Bilirubin Urine: NEGATIVE
Hgb urine dipstick: NEGATIVE
Ketones, ur: NEGATIVE
Leukocytes,Ua: NEGATIVE
Nitrite: NEGATIVE
Specific Gravity, Urine: 1.015 (ref 1.000–1.030)
Total Protein, Urine: NEGATIVE
Urine Glucose: NEGATIVE
Urobilinogen, UA: 1 (ref 0.0–1.0)
pH: 7 (ref 5.0–8.0)

## 2023-10-26 LAB — COMPREHENSIVE METABOLIC PANEL WITH GFR
ALT: 21 U/L (ref 0–53)
AST: 19 U/L (ref 0–37)
Albumin: 4.3 g/dL (ref 3.5–5.2)
Alkaline Phosphatase: 45 U/L (ref 39–117)
BUN: 11 mg/dL (ref 6–23)
CO2: 27 meq/L (ref 19–32)
Calcium: 9 mg/dL (ref 8.4–10.5)
Chloride: 105 meq/L (ref 96–112)
Creatinine, Ser: 1.01 mg/dL (ref 0.40–1.50)
GFR: 93.18 mL/min (ref >60.00)
Glucose, Bld: 101 mg/dL — ABNORMAL HIGH (ref 70–99)
Potassium: 4 meq/L (ref 3.5–5.1)
Sodium: 139 meq/L (ref 135–145)
Total Bilirubin: 1 mg/dL (ref 0.2–1.2)
Total Protein: 7.5 g/dL (ref 6.0–8.3)

## 2023-10-26 LAB — CBC WITH DIFFERENTIAL/PLATELET
Basophils Absolute: 0 K/uL (ref 0.0–0.1)
Basophils Relative: 0.6 % (ref 0.0–3.0)
Eosinophils Absolute: 0.1 K/uL (ref 0.0–0.7)
Eosinophils Relative: 2.6 % (ref 0.0–5.0)
HCT: 41 % (ref 39.0–52.0)
Hemoglobin: 13.7 g/dL (ref 13.0–17.0)
Lymphocytes Relative: 52.2 % — ABNORMAL HIGH (ref 12.0–46.0)
Lymphs Abs: 1.6 K/uL (ref 0.7–4.0)
MCHC: 33.5 g/dL (ref 30.0–36.0)
MCV: 78.9 fl (ref 78.0–100.0)
Monocytes Absolute: 0.3 K/uL (ref 0.1–1.0)
Monocytes Relative: 10.7 % (ref 3.0–12.0)
Neutro Abs: 1 K/uL — ABNORMAL LOW (ref 1.4–7.7)
Neutrophils Relative %: 33.9 % — ABNORMAL LOW (ref 43.0–77.0)
Platelets: 176 K/uL (ref 150.0–400.0)
RBC: 5.2 Mil/uL (ref 4.22–5.81)
RDW: 13.5 % (ref 11.5–15.5)
WBC: 3.1 K/uL — ABNORMAL LOW (ref 4.0–10.5)

## 2023-10-26 LAB — TESTOSTERONE: Testosterone: 459.35 ng/dL (ref 300.00–890.00)

## 2023-10-26 LAB — PSA: PSA: 1.51 ng/mL (ref 0.10–4.00)

## 2023-10-26 LAB — VITAMIN D 25 HYDROXY (VIT D DEFICIENCY, FRACTURES): VITD: 15.27 ng/mL — ABNORMAL LOW (ref 30.00–100.00)

## 2023-10-26 LAB — TSH: TSH: 0.49 u[IU]/mL (ref 0.35–5.50)

## 2023-10-26 LAB — HEMOGLOBIN A1C: Hgb A1c MFr Bld: 5.5 % (ref 4.6–6.5)

## 2023-10-26 LAB — VITAMIN B12: Vitamin B-12: 906 pg/mL (ref 211–911)

## 2023-10-26 NOTE — Progress Notes (Signed)
 Samuel Byrd 38 y.o.   Chief Complaint  Patient presents with   Annual Exam    Patient here for physical. Patient wants to discuss fertility . No other concerns     HISTORY OF PRESENT ILLNESS: This is a 38 y.o. male A1A here for annual exam. Has some questions about fertility Married.  Has 51-year-old daughter.  Trying to get pregnant again.  Wife was recently evaluated by gynecologist and had ultrasound done in [place] and came back normal No other complaints or medical concerns today.  HPI   Prior to Admission medications   Medication Sig Start Date End Date Taking? Authorizing Provider  pantoprazole  (PROTONIX ) 40 MG tablet TAKE 1 TABLET BY MOUTH EVERY DAY 09/22/22  Yes Prajna Vanderpool, Emil Schanz, MD  atovaquone -proguanil (MALARONE ) 250-100 MG TABS tablet TAKE 1 TABLET BY MOUTH DAILY. START 1 TO 2 DAYS BEFORE TRAVELING, DAILY WHILE IN [place], AND CONTINUE DAILY FOR 7 DAYS AFTER RETURNING. Patient not taking: Reported on 10/26/2023 08/02/22   Sharone Picchi Jose, MD  famotidine  (PEPCID ) 40 MG tablet Take 1 tablet (40 mg total) by mouth at bedtime. 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   History of peptic ulcer disease 11/29/2018   Gynecomastia 04/28/2017   PUD (peptic ulcer disease) 10/09/2016    Past Medical History:  Diagnosis Date   Gynecomastia 12/2017   right    Past Surgical History:  Procedure Laterality Date   ESOPHAGOGASTRODUODENOSCOPY N/A 07/23/2015   Procedure: ESOPHAGOGASTRODUODENOSCOPY (EGD);  Surgeon: Victory LITTIE Legrand DOUGLAS, MD;  Location: [facility];  Service: Endoscopy;  Laterality: N/A;   GYNECOMASTIA MASTECTOMY Right 12/23/2017   Procedure: RIGHT SUBCUTANEOUS MASTECTOMY GYNECOMASTIA;  Surgeon: Belinda Cough, MD;  Location: [facility];  Service: General;  Laterality: Right;    Social History   Socioeconomic History   Marital status: Married    Spouse name: Not on file   Number of children: 0   Years of education: Not on file   Highest education level: Not on file  Occupational History   Occupation: [employer]  Tobacco Use   Smoking status: Former    Current packs/day: 0.00    Types: Cigarettes    Quit date: 01/07/2017    Years since quitting: 6.8   Smokeless tobacco: Never  Vaping Use   Vaping status: Never Used  Substance and Sexual Activity   Alcohol use: Yes    Comment: occasionally   Drug use: No   Sexual activity: Not on file  Other Topics Concern   Not on file  Social History Narrative   Not on file   Social Drivers of Health   Financial Resource Strain: Not on file  Food Insecurity: Not on file  Transportation Needs: Not on file  Physical Activity: Not on file  Stress: Not on file  Social Connections: Not on file  Intimate Partner Violence: Not on file    Family History  Problem Relation Age of Onset   Heart attack Mother    Ulcers Brother      Review of Systems  Constitutional: Negative.  Negative for chills and fever.  HENT: Negative.  Negative for congestion and sore throat.   Respiratory: Negative.  Negative for cough and shortness of breath.   Cardiovascular: Negative.  Negative for  chest pain and palpitations.  Gastrointestinal:  Negative for abdominal pain, diarrhea, nausea and vomiting.  Genitourinary: Negative.  Negative for dysuria and hematuria.  Skin: Negative.  Negative for rash.  Neurological: Negative.  Negative for dizziness and headaches.  All other systems reviewed and are negative.   Vitals:   10/26/23 1050  BP: 118/68  Pulse: 76  Temp: 98.9 F (37.2 C)  SpO2: 99%    Physical Exam Vitals reviewed.  Constitutional:      Appearance: Normal appearance.  HENT:     Head: Normocephalic.     Right Ear: Tympanic membrane, ear canal and  external ear normal.     Left Ear: Tympanic membrane, ear canal and external ear normal.     Mouth/Throat:     Mouth: Mucous membranes are moist.     Pharynx: Oropharynx is clear.  Eyes:     Extraocular Movements: Extraocular movements intact.     Conjunctiva/sclera: Conjunctivae normal.     Pupils: Pupils are equal, round, and reactive to light.  Cardiovascular:     Rate and Rhythm: Normal rate and regular rhythm.     Pulses: Normal pulses.     Heart sounds: Normal heart sounds.  Pulmonary:     Effort: Pulmonary effort is normal.     Breath sounds: Normal breath sounds.  Abdominal:     Palpations: Abdomen is soft.     Tenderness: There is no abdominal tenderness.  Musculoskeletal:     Cervical back: No tenderness.  Lymphadenopathy:     Cervical: No cervical adenopathy.  Skin:    General: Skin is warm and dry.     Capillary Refill: Capillary refill takes less than 2 seconds.  Neurological:     General: No focal deficit present.     Mental Status: He is alert and oriented to person, place, and time.  Psychiatric:        Mood and Affect: Mood normal.        Behavior: Behavior normal.      ASSESSMENT & PLAN: Problem List Items Addressed This Visit   None Visit Diagnoses       Routine general medical examination at a health care facility    -  Primary   Relevant Orders   Urinalysis   Testosterone    Estradiol    Comprehensive metabolic panel with GFR   CBC with Differential/Platelet   TSH   Hemoglobin A1c   Lipid panel   Vitamin B12   VITAMIN D  25 Hydroxy (Vit-D Deficiency, Fractures)   PSA     Screening for deficiency anemia       Relevant Orders   CBC with Differential/Platelet     Screening for lipoid disorders       Relevant Orders   Lipid panel     Screening for endocrine, metabolic and immunity disorder       Relevant Orders   Testosterone    Estradiol    Comprehensive metabolic panel with GFR   TSH   Hemoglobin A1c   Vitamin B12   VITAMIN D  25  Hydroxy (Vit-D Deficiency, Fractures)   PSA      Modifiable risk factors discussed with patient. Anticipatory guidance according to age provided. The following topics were also discussed: Social Determinants of Health Smoking.  Non-smoker Diet and nutrition Benefits of exercise Cancer family history review Vaccinations review and recommendations Cardiovascular risk assessment and need for blood work Mental health including depression and anxiety Fall and accident prevention  Patient Instructions  Health Maintenance, Male  Adopting a healthy lifestyle and getting preventive care are important in promoting health and wellness. Ask your health care provider about: The right schedule for you to have regular tests and exams. Things you can do on your own to prevent diseases and keep yourself healthy. What should I know about diet, weight, and exercise? Eat a healthy diet  Eat a diet that includes plenty of vegetables, fruits, low-fat dairy products, and lean protein. Do not eat a lot of foods that are high in solid fats, added sugars, or sodium. Maintain a healthy weight Body mass index (BMI) is a measurement that can be used to identify possible weight problems. It estimates body fat based on height and weight. Your health care provider can help determine your BMI and help you achieve or maintain a healthy weight. Get regular exercise Get regular exercise. This is one of the most important things you can do for your health. Most adults should: Exercise for at least 150 minutes each week. The exercise should increase your heart rate and make you sweat (moderate-intensity exercise). Do strengthening exercises at least twice a week. This is in addition to the moderate-intensity exercise. Spend less time sitting. Even light physical activity can be beneficial. Watch cholesterol and blood lipids Have your blood tested for lipids and cholesterol at 40 years of age, then have this test every  5 years. You may need to have your cholesterol levels checked more often if: Your lipid or cholesterol levels are high. You are older than 40 years of age. You are at high risk for heart disease. What should I know about cancer screening? Many types of cancers can be detected early and may often be prevented. Depending on your health history and family history, you may need to have cancer screening at various ages. This may include screening for: Colorectal cancer. Prostate cancer. Skin cancer. Lung cancer. What should I know about heart disease, diabetes, and high blood pressure? Blood pressure and heart disease High blood pressure causes heart disease and increases the risk of stroke. This is more likely to develop in people who have high blood pressure readings or are overweight. Talk with your health care provider about your target blood pressure readings. Have your blood pressure checked: Every 3-5 years if you are 29-42 years of age. Every year if you are 26 years old or older. If you are between the ages of 23 and 4 and are a current or former smoker, ask your health care provider if you should have a one-time screening for abdominal aortic aneurysm (AAA). Diabetes Have regular diabetes screenings. This checks your fasting blood sugar level. Have the screening done: Once every three years after age 15 if you are at a normal weight and have a low risk for diabetes. More often and at a younger age if you are overweight or have a high risk for diabetes. What should I know about preventing infection? Hepatitis B If you have a higher risk for hepatitis B, you should be screened for this virus. Talk with your health care provider to find out if you are at risk for hepatitis B infection. Hepatitis C Blood testing is recommended for: Everyone born from [birth year] through 1967. Anyone with known risk factors for hepatitis C. Sexually transmitted infections (STIs) You should be screened each  year for STIs, including gonorrhea and chlamydia, if: You are sexually active and are younger than 40 years of age. You are older than 40 years of age and your health care  provider tells you that you are at risk for this type of infection. Your sexual activity has changed since you were last screened, and you are at increased risk for chlamydia or gonorrhea. Ask your health care provider if you are at risk. Ask your health care provider about whether you are at high risk for HIV. Your health care provider may recommend a prescription medicine to help prevent HIV infection. If you choose to take medicine to prevent HIV, you should first get tested for HIV. You should then be tested every 3 months for as long as you are taking the medicine. Follow these instructions at home: Alcohol use Do not drink alcohol if your health care provider tells you not to drink. If you drink alcohol: Limit how much you have to 0-2 drinks a day. Know how much alcohol is in your drink. In the [place] one drink equals one 12 oz bottle of beer (355 mL), one 5 oz glass of wine (148 mL), or one 1 oz glass of hard liquor (44 mL). Lifestyle Do not use any products that contain nicotine or tobacco. These products include cigarettes, chewing tobacco, and vaping devices, such as e-cigarettes. If you need help quitting, ask your health care provider. Do not use street drugs. Do not share needles. Ask your health care provider for help if you need support or information about quitting drugs. General instructions Schedule regular health, dental, and eye exams. Stay current with your vaccines. Tell your health care provider if: You often feel depressed. You have ever been abused or do not feel safe at home. Summary Adopting a healthy lifestyle and getting preventive care are important in promoting health and wellness. Follow your health care provider's instructions about healthy diet, exercising, and getting tested or screened  for diseases. Follow your health care provider's instructions on monitoring your cholesterol and blood pressure. This information is not intended to replace advice given to you by your health care provider. Make sure you discuss any questions you have with your health care provider. Document Revised: 07/16/2020 Document Reviewed: 07/16/2020 Elsevier Patient Education  2024 [employer] Inc.     Emil Schaumann, MD [facility]

## 2023-10-26 NOTE — Patient Instructions (Signed)
 Health Maintenance, Male  Adopting a healthy lifestyle and getting preventive care are important in promoting health and wellness. Ask your health care provider about:  The right schedule for you to have regular tests and exams.  Things you can do on your own to prevent diseases and keep yourself healthy.  What should I know about diet, weight, and exercise?  Eat a healthy diet    Eat a diet that includes plenty of vegetables, fruits, low-fat dairy products, and lean protein.  Do not eat a lot of foods that are high in solid fats, added sugars, or sodium.  Maintain a healthy weight  Body mass index (BMI) is a measurement that can be used to identify possible weight problems. It estimates body fat based on height and weight. Your health care provider can help determine your BMI and help you achieve or maintain a healthy weight.  Get regular exercise  Get regular exercise. This is one of the most important things you can do for your health. Most adults should:  Exercise for at least 150 minutes each week. The exercise should increase your heart rate and make you sweat (moderate-intensity exercise).  Do strengthening exercises at least twice a week. This is in addition to the moderate-intensity exercise.  Spend less time sitting. Even light physical activity can be beneficial.  Watch cholesterol and blood lipids  Have your blood tested for lipids and cholesterol at 40 years of age, then have this test every 5 years.  You may need to have your cholesterol levels checked more often if:  Your lipid or cholesterol levels are high.  You are older than 40 years of age.  You are at high risk for heart disease.  What should I know about cancer screening?  Many types of cancers can be detected early and may often be prevented. Depending on your health history and family history, you may need to have cancer screening at various ages. This may include screening for:  Colorectal cancer.  Prostate cancer.  Skin cancer.  Lung  cancer.  What should I know about heart disease, diabetes, and high blood pressure?  Blood pressure and heart disease  High blood pressure causes heart disease and increases the risk of stroke. This is more likely to develop in people who have high blood pressure readings or are overweight.  Talk with your health care provider about your target blood pressure readings.  Have your blood pressure checked:  Every 3-5 years if you are 40-40 years of age.  Every year if you are 3 years old or older.  If you are between the ages of 60 and 72 and are a current or former smoker, ask your health care provider if you should have a one-time screening for abdominal aortic aneurysm (AAA).  Diabetes  Have regular diabetes screenings. This checks your fasting blood sugar level. Have the screening done:  Once every three years after age 40 if you are at a normal weight and have a low risk for diabetes.  More often and at a younger age if you are overweight or have a high risk for diabetes.  What should I know about preventing infection?  Hepatitis B  If you have a higher risk for hepatitis B, you should be screened for this virus. Talk with your health care provider to find out if you are at risk for hepatitis B infection.  Hepatitis C  Blood testing is recommended for:  Everyone born from [birth year] through 1967.  Anyone  with known risk factors for hepatitis C.  Sexually transmitted infections (STIs)  You should be screened each year for STIs, including gonorrhea and chlamydia, if:  You are sexually active and are younger than 40 years of age.  You are older than 40 years of age and your health care provider tells you that you are at risk for this type of infection.  Your sexual activity has changed since you were last screened, and you are at increased risk for chlamydia or gonorrhea. Ask your health care provider if you are at risk.  Ask your health care provider about whether you are at high risk for HIV. Your health care provider  may recommend a prescription medicine to help prevent HIV infection. If you choose to take medicine to prevent HIV, you should first get tested for HIV. You should then be tested every 3 months for as long as you are taking the medicine.  Follow these instructions at home:  Alcohol use  Do not drink alcohol if your health care provider tells you not to drink.  If you drink alcohol:  Limit how much you have to 0-2 drinks a day.  Know how much alcohol is in your drink. In the [place] one drink equals one 12 oz bottle of beer (355 mL), one 5 oz glass of wine (148 mL), or one 1 oz glass of hard liquor (44 mL).  Lifestyle  Do not use any products that contain nicotine or tobacco. These products include cigarettes, chewing tobacco, and vaping devices, such as e-cigarettes. If you need help quitting, ask your health care provider.  Do not use street drugs.  Do not share needles.  Ask your health care provider for help if you need support or information about quitting drugs.  General instructions  Schedule regular health, dental, and eye exams.  Stay current with your vaccines.  Tell your health care provider if:  You often feel depressed.  You have ever been abused or do not feel safe at home.  Summary  Adopting a healthy lifestyle and getting preventive care are important in promoting health and wellness.  Follow your health care provider's instructions about healthy diet, exercising, and getting tested or screened for diseases.  Follow your health care provider's instructions on monitoring your cholesterol and blood pressure.  This information is not intended to replace advice given to you by your health care provider. Make sure you discuss any questions you have with your health care provider.  Document Revised: 07/16/2020 Document Reviewed: 07/16/2020  Elsevier Patient Education  2024 [employer].

## 2023-10-27 LAB — ESTRADIOL: Estradiol: 33 pg/mL (ref <=39)

## 2023-12-06 ENCOUNTER — Other Ambulatory Visit: Payer: Self-pay | Admitting: Emergency Medicine

## 2023-12-06 DIAGNOSIS — Z8711 Personal history of peptic ulcer disease: Secondary | ICD-10-CM
